# Patient Record
Sex: Female | Born: 1960 | Race: White | Hispanic: No | Marital: Married | Smoking: Never smoker
Health system: Southern US, Community
[De-identification: ages and names within clinical notes are randomized; demographics above are authoritative.]

## PROBLEM LIST (undated history)

## (undated) DIAGNOSIS — R220 Localized swelling, mass and lump, head: Secondary | ICD-10-CM

## (undated) DIAGNOSIS — G4730 Sleep apnea, unspecified: Secondary | ICD-10-CM

## (undated) HISTORY — PX: ABDOMINAL HYSTERECTOMY: SHX81

## (undated) HISTORY — PX: CHOLECYSTECTOMY: SHX55

---

## 2011-07-05 ENCOUNTER — Other Ambulatory Visit: Payer: Self-pay | Admitting: Obstetrics and Gynecology

## 2011-07-05 DIAGNOSIS — R928 Other abnormal and inconclusive findings on diagnostic imaging of breast: Secondary | ICD-10-CM

## 2011-07-06 ENCOUNTER — Ambulatory Visit
Admission: RE | Admit: 2011-07-06 | Discharge: 2011-07-06 | Disposition: A | Discharge status: Home / Self Care | Payer: PRIVATE HEALTH INSURANCE | Source: Ambulatory Visit | Attending: Obstetrics and Gynecology | Admitting: Obstetrics and Gynecology

## 2011-07-06 DIAGNOSIS — R928 Other abnormal and inconclusive findings on diagnostic imaging of breast: Secondary | ICD-10-CM

## 2013-05-03 IMAGING — MG MM DIGITAL DIAGNOSTIC UNILAT*L*
3 series · 3 of 3 positions shown · non-contrast
Comparison: Previous examinations, including the screening
mammogram dated 06/29/2011.

CLINICAL DATA: Left breast calcifications and possible mass at
recent screening mammography.  Mass felt by the patient along the
left anterior chest wall above the medial aspect of the breast.

DIGITAL DIAGNOSTIC LEFT MAMMOGRAM  AND LEFT BREAST ULTRASOUND:

[L CC]
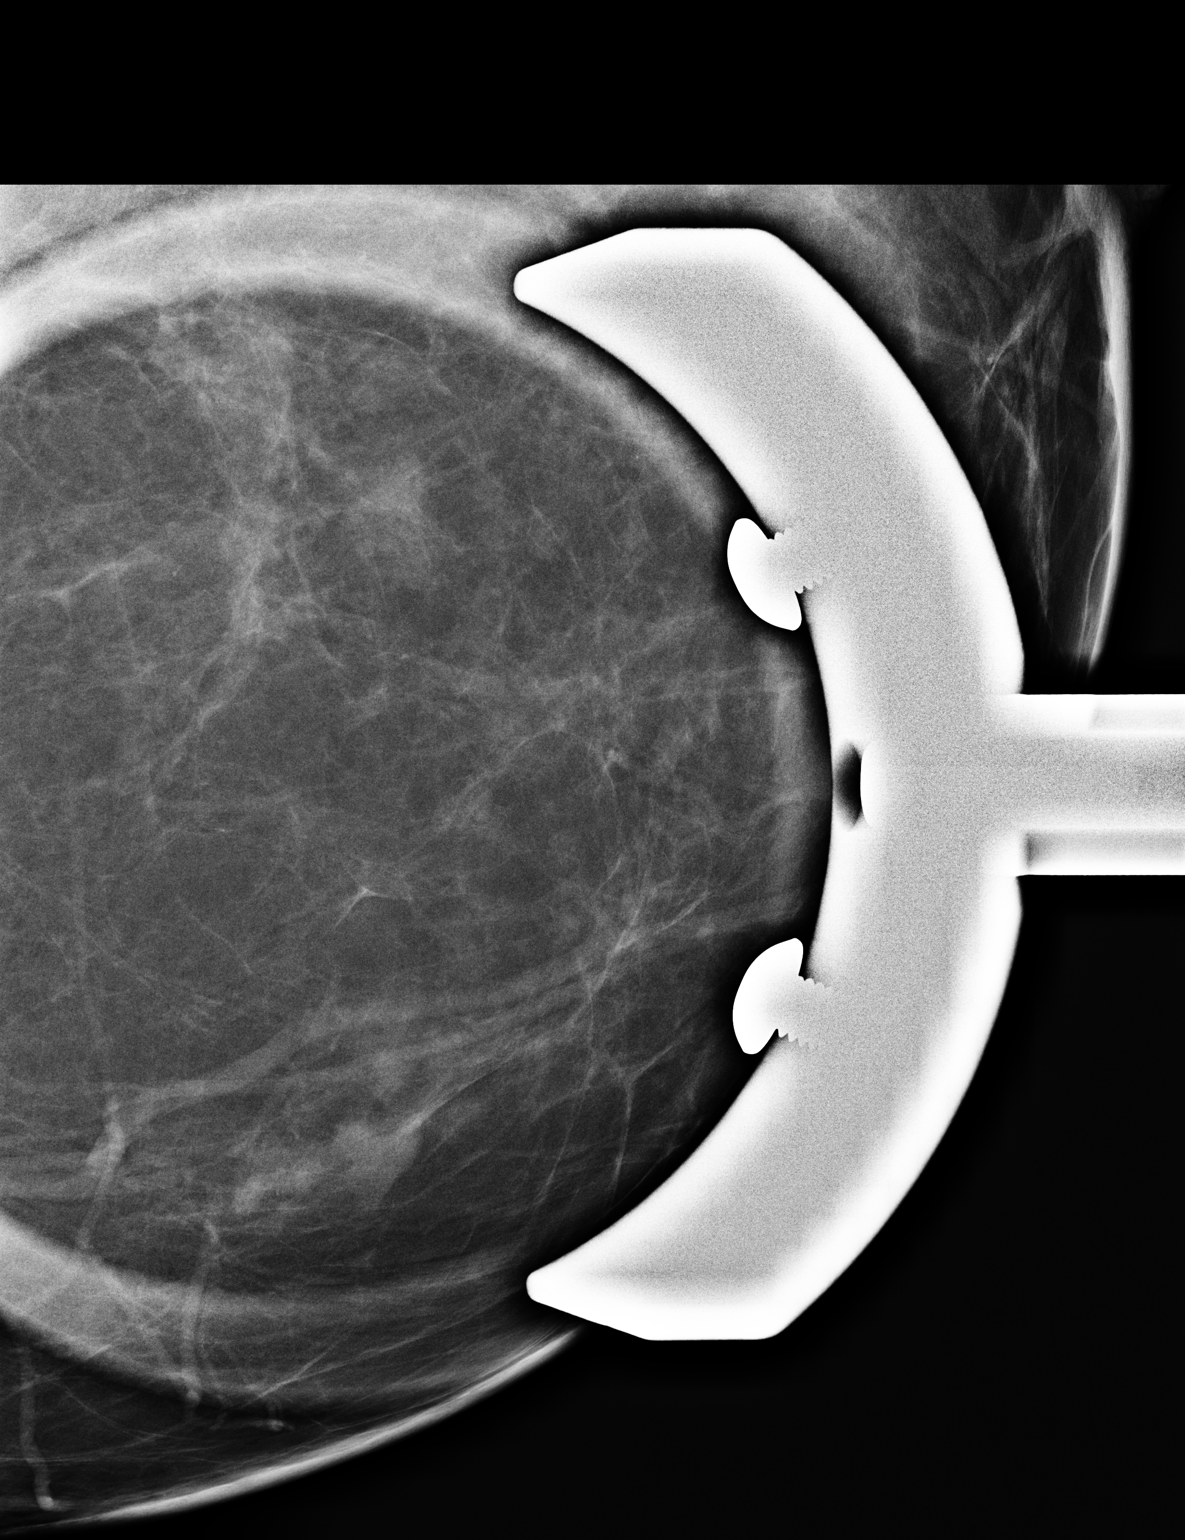

[L ML (1 of 2)]
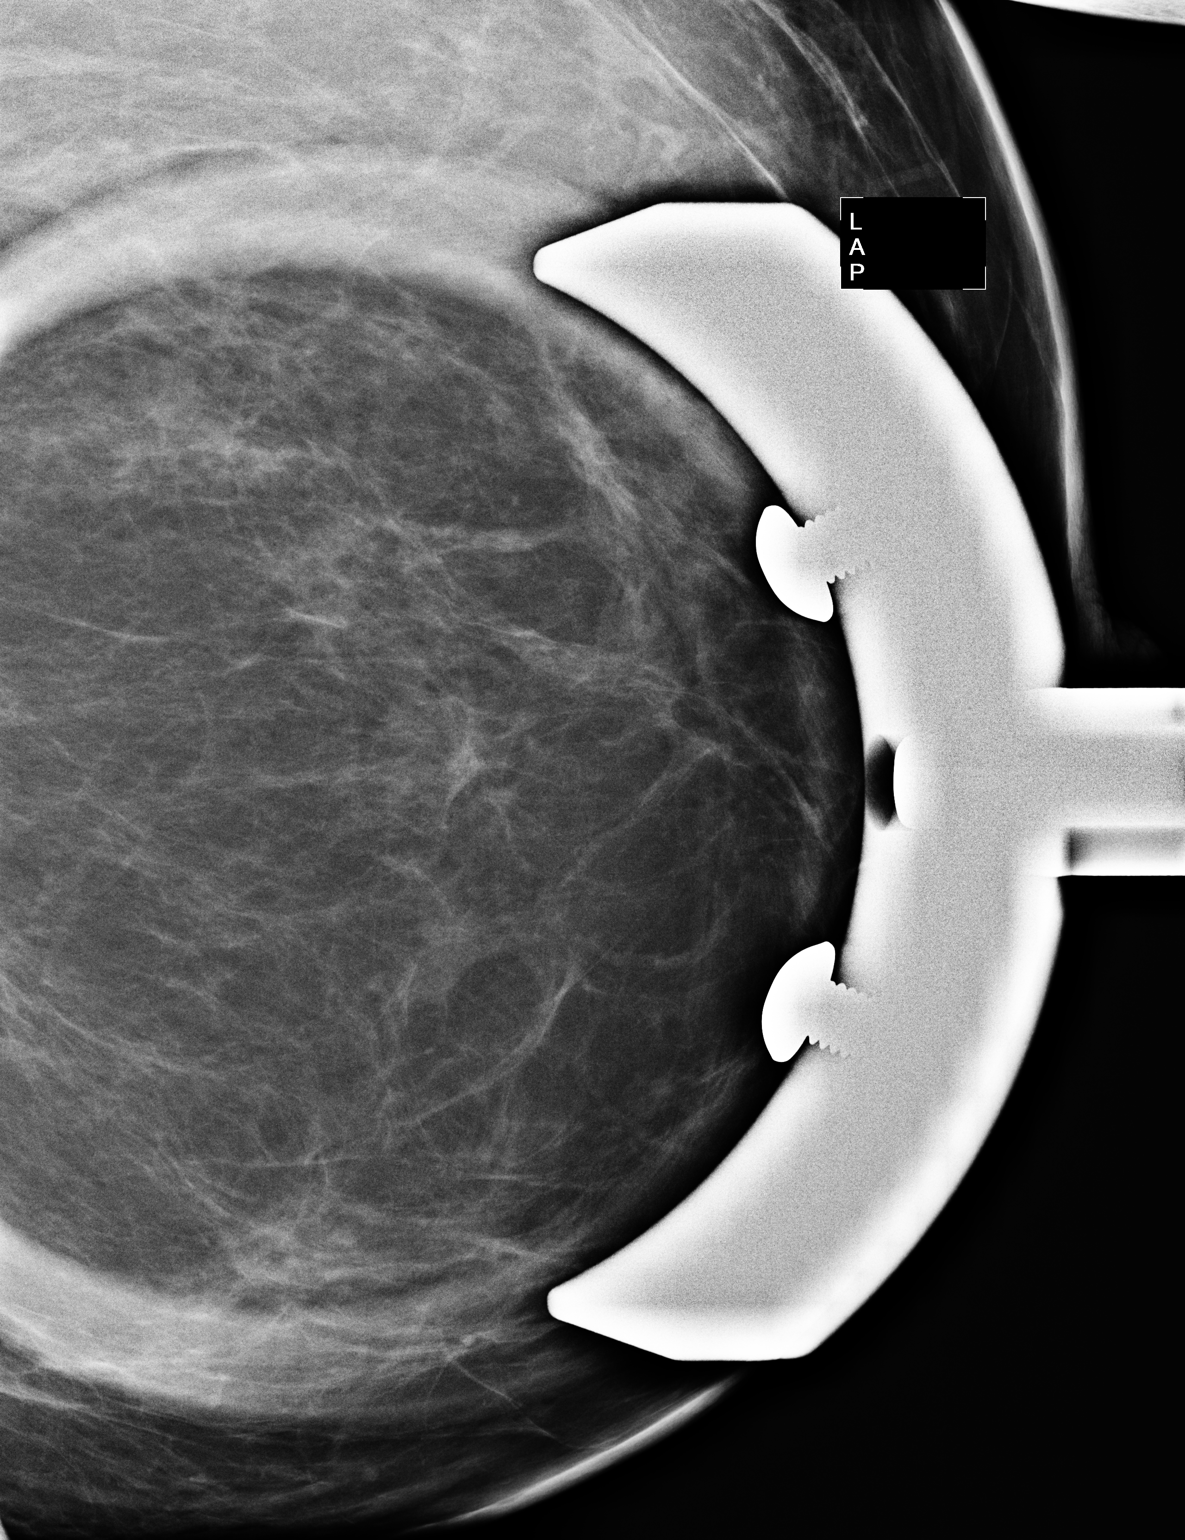

[L ML (2 of 2)]
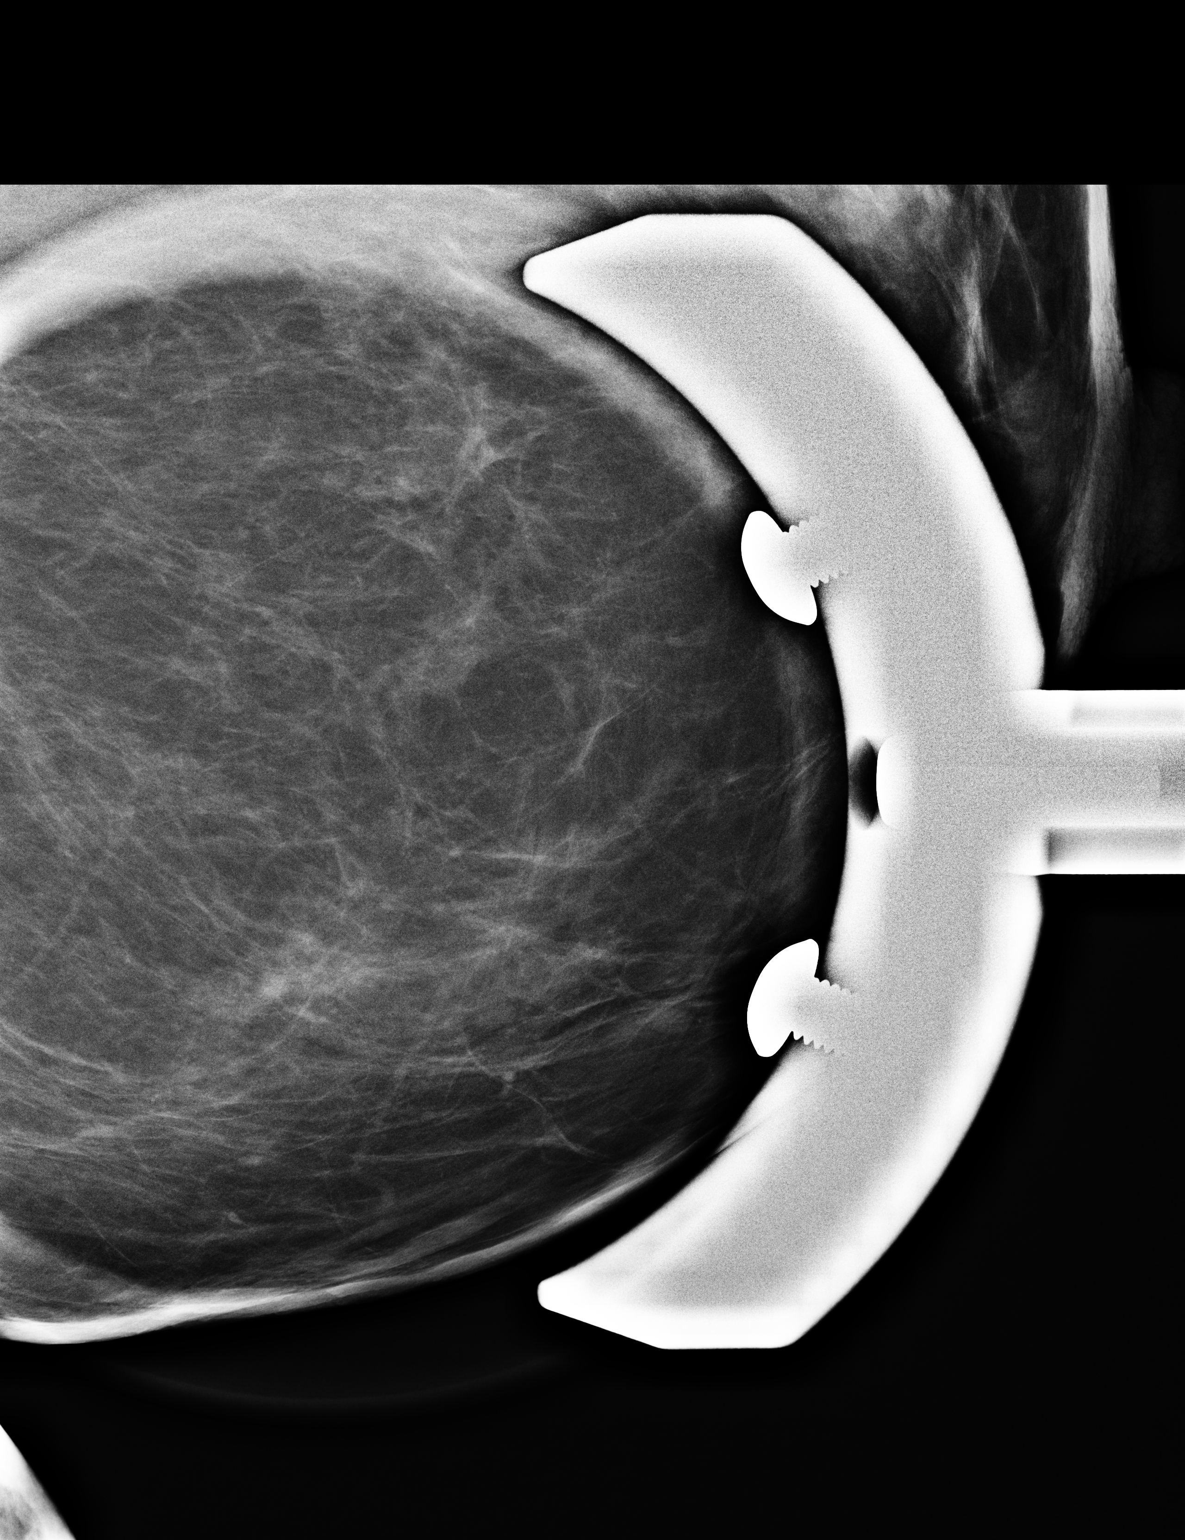

[3 of 3 positions shown; findings below may reference images not displayed]

FINDINGS: Spot magnification views of the left breast demonstrate
a persistent elongated, curvilinear density with smooth margins in
the far medial aspect of the left breast.  No calcifications are
seen.  A spot tangential view could not be obtained of the mass
felt by the patient, due to the fact that it is not in the breast.

On physical exam, the patient has an approximately 5 mm rounded,
smoothly marginated, mobile palpable mass beneath the skin in the
upper left chest wall above the medial aspect of the left breast.
There is no mass palpable in the medial left breast.

Ultrasound is performed, showing a 1.0 x 0.9 x 0.5 cm oval,
horizontally oriented, poorly defined echogenic mass-like area just
beneath the skin in the anterior left chest wall, corresponding to
the palpable mass.  This has some central decreased echogenicity.

In the 9:30 o'clock position of the left breast, 12 cm from the
nipple, an island of fibroglandular tissue is demonstrated
containing multiple small cysts.  These include a cluster of cyst
with a maximum combined diameter of 7 mm.  The largest individual
cyst has a maximum diameter of 4 mm.
IMPRESSION: 1.  Cluster of cysts within an island of fibroglandular tissue in
the 9:30 o'clock position of the left breast, corresponding to the
area of increased density seen mammographically.
2.  1.0 cm area of fat necrosis in the left upper chest wall
subcutaneous fat, corresponding to the mass felt by the patient.
3.  No evidence of malignancy.  Annual screening mammography is
recommended.

BI-RADS CATEGORY 2:  Benign finding(s).

Recommendation:  Bilateral screening mammogram in 1 year.

## 2013-05-03 IMAGING — US US BREAST*L*
1 series · 13 of 16 positions shown · non-contrast
Comparison: Previous examinations, including the screening
mammogram dated 06/29/2011.

CLINICAL DATA: Left breast calcifications and possible mass at
recent screening mammography.  Mass felt by the patient along the
left anterior chest wall above the medial aspect of the breast.

DIGITAL DIAGNOSTIC LEFT MAMMOGRAM  AND LEFT BREAST ULTRASOUND:

[Series 1: us breast*left* · 13 of 16 slices shown]
[im 1/16]
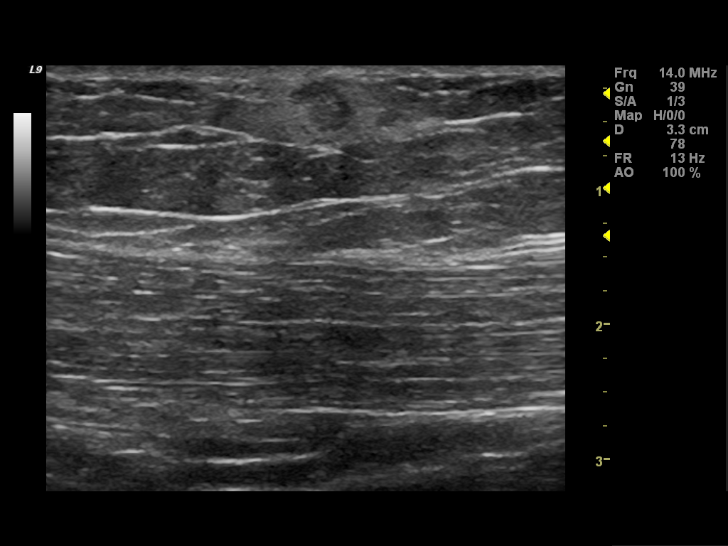
[im 2/16]
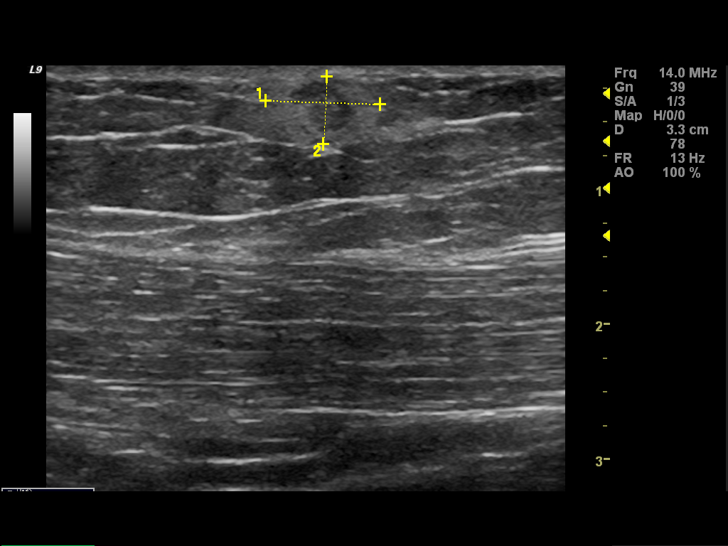
[im 4/16]
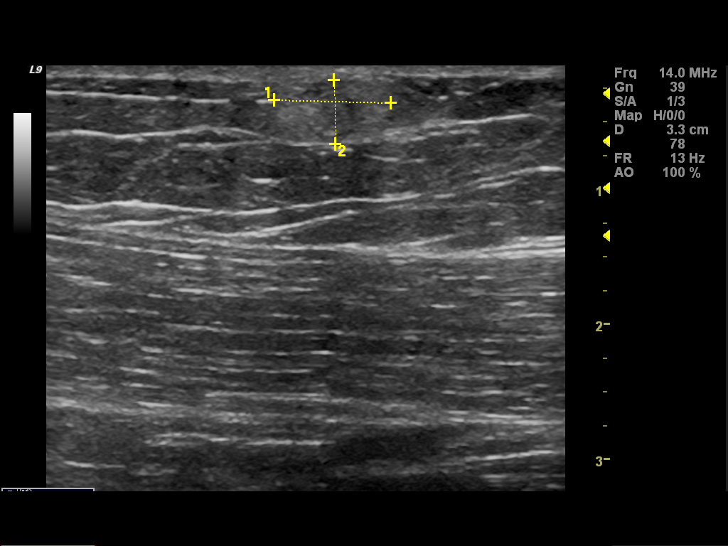
[im 5/16]
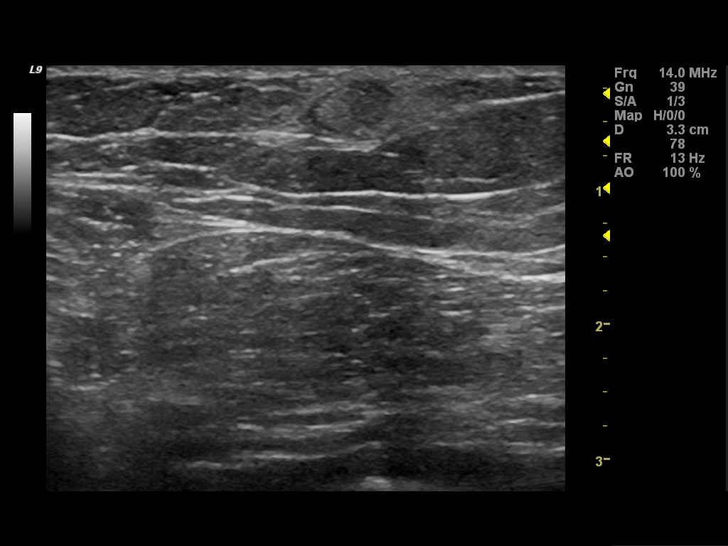
[im 6/16]
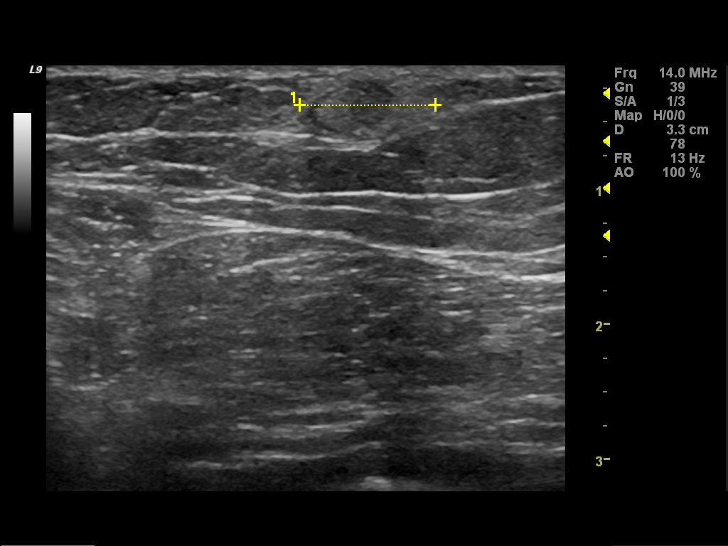
[im 7/16]
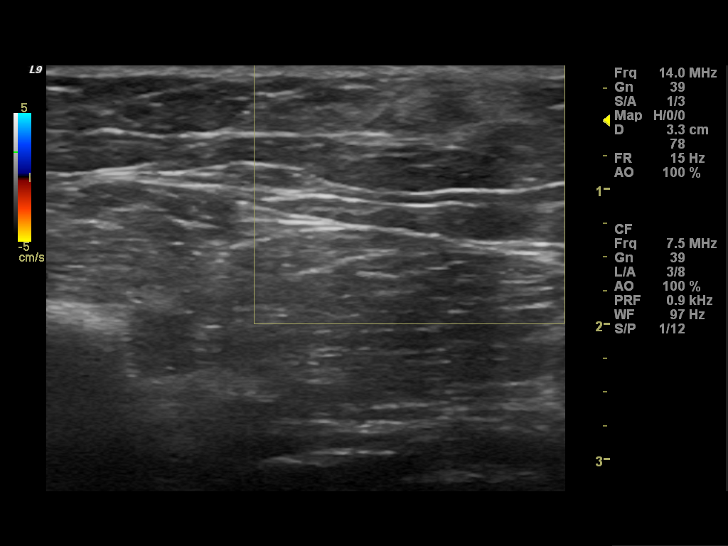
[im 9/16]
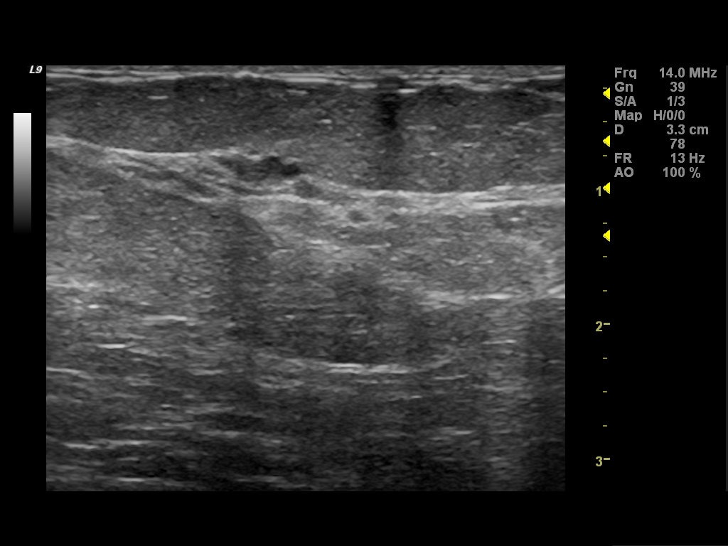
[im 10/16]
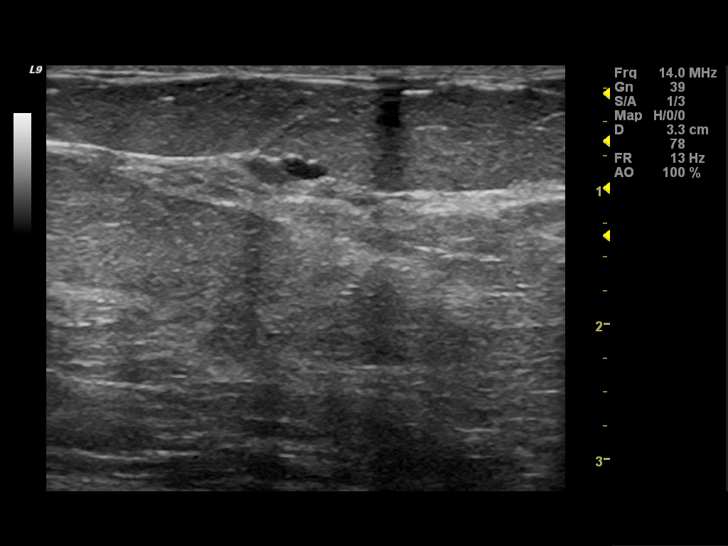
[im 11/16]
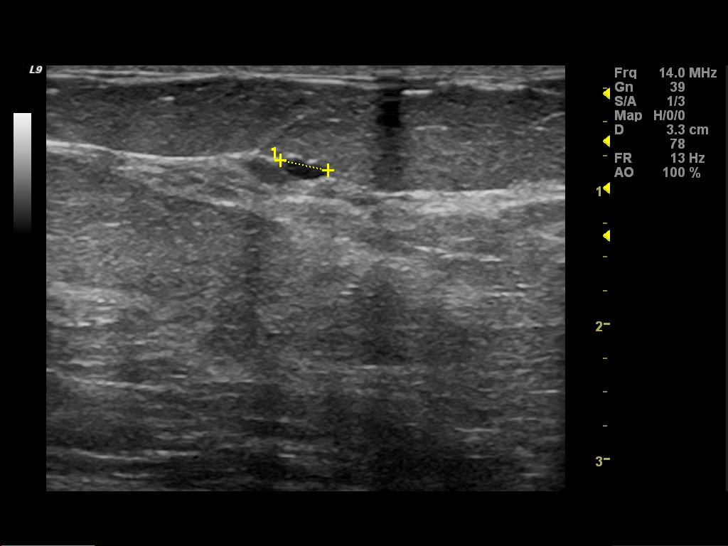
[im 12/16]
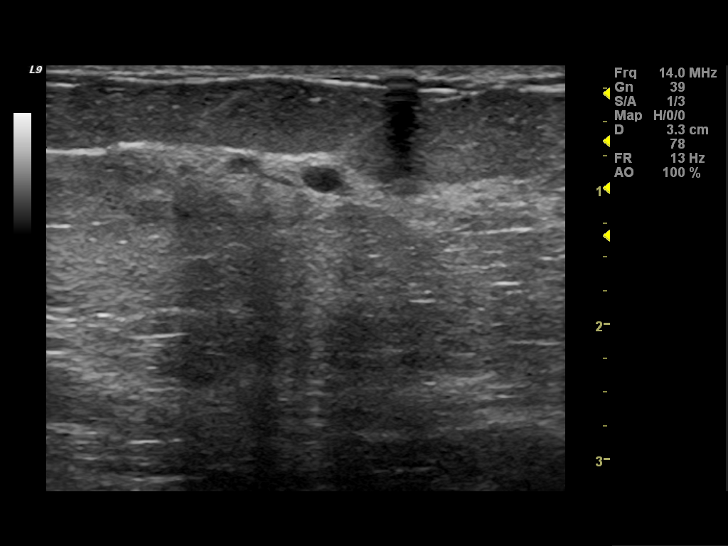
[im 13/16]
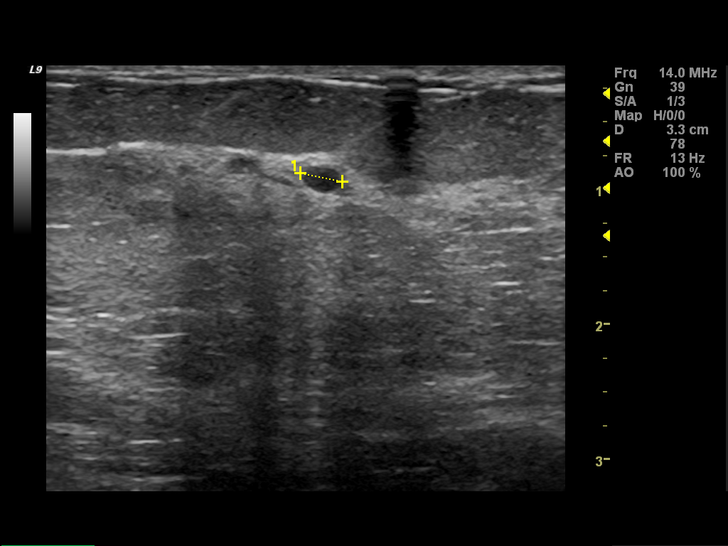
[im 15/16]
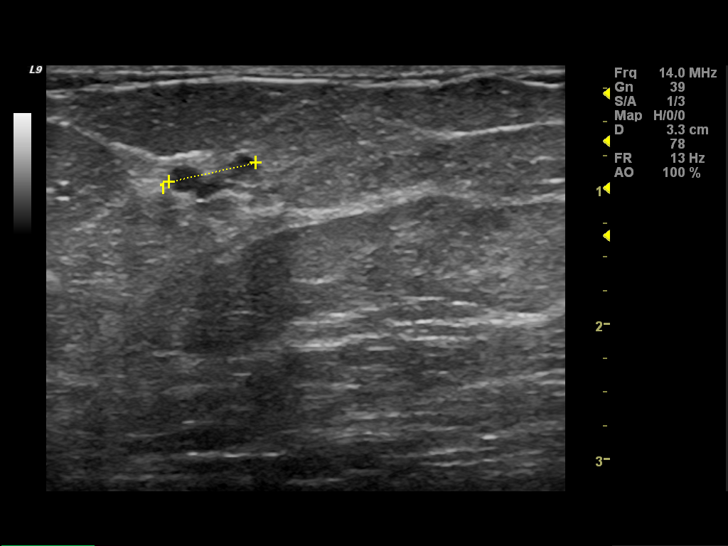
[im 16/16]
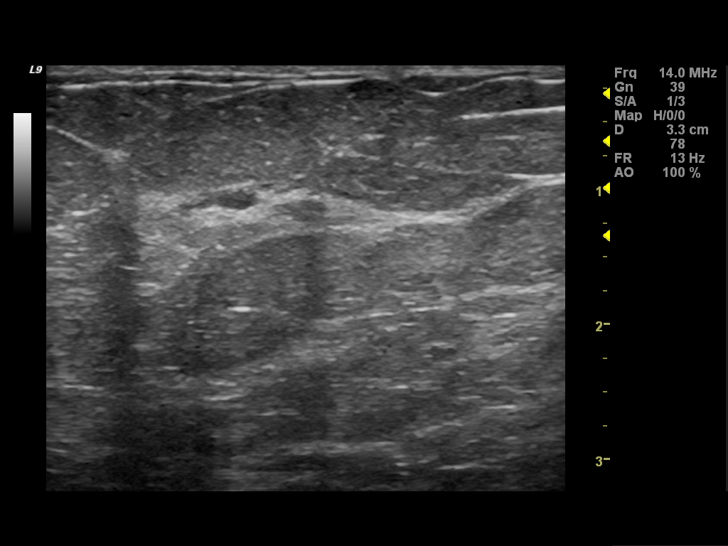

[13 of 16 positions shown; findings below may reference images not displayed]

FINDINGS: Spot magnification views of the left breast demonstrate
a persistent elongated, curvilinear density with smooth margins in
the far medial aspect of the left breast.  No calcifications are
seen.  A spot tangential view could not be obtained of the mass
felt by the patient, due to the fact that it is not in the breast.

On physical exam, the patient has an approximately 5 mm rounded,
smoothly marginated, mobile palpable mass beneath the skin in the
upper left chest wall above the medial aspect of the left breast.
There is no mass palpable in the medial left breast.

Ultrasound is performed, showing a 1.0 x 0.9 x 0.5 cm oval,
horizontally oriented, poorly defined echogenic mass-like area just
beneath the skin in the anterior left chest wall, corresponding to
the palpable mass.  This has some central decreased echogenicity.

In the 9:30 o'clock position of the left breast, 12 cm from the
nipple, an island of fibroglandular tissue is demonstrated
containing multiple small cysts.  These include a cluster of cyst
with a maximum combined diameter of 7 mm.  The largest individual
cyst has a maximum diameter of 4 mm.
IMPRESSION: 1.  Cluster of cysts within an island of fibroglandular tissue in
the 9:30 o'clock position of the left breast, corresponding to the
area of increased density seen mammographically.
2.  1.0 cm area of fat necrosis in the left upper chest wall
subcutaneous fat, corresponding to the mass felt by the patient.
3.  No evidence of malignancy.  Annual screening mammography is
recommended.

BI-RADS CATEGORY 2:  Benign finding(s).

Recommendation:  Bilateral screening mammogram in 1 year.

## 2014-09-30 ENCOUNTER — Other Ambulatory Visit: Payer: Self-pay | Admitting: Obstetrics and Gynecology

## 2014-09-30 DIAGNOSIS — R928 Other abnormal and inconclusive findings on diagnostic imaging of breast: Secondary | ICD-10-CM

## 2014-10-03 ENCOUNTER — Ambulatory Visit
Admission: RE | Admit: 2014-10-03 | Discharge: 2014-10-03 | Disposition: A | Discharge status: Home / Self Care | Payer: BLUE CROSS/BLUE SHIELD | Source: Ambulatory Visit | Attending: Obstetrics and Gynecology | Admitting: Obstetrics and Gynecology

## 2014-10-03 DIAGNOSIS — R928 Other abnormal and inconclusive findings on diagnostic imaging of breast: Secondary | ICD-10-CM

## 2014-11-14 ENCOUNTER — Other Ambulatory Visit: Payer: Self-pay | Admitting: Obstetrics and Gynecology

## 2014-11-14 DIAGNOSIS — R1011 Right upper quadrant pain: Secondary | ICD-10-CM

## 2014-11-20 ENCOUNTER — Ambulatory Visit
Admission: RE | Admit: 2014-11-20 | Discharge: 2014-11-20 | Disposition: A | Discharge status: Home / Self Care | Payer: BLUE CROSS/BLUE SHIELD | Source: Ambulatory Visit | Attending: Obstetrics and Gynecology | Admitting: Obstetrics and Gynecology

## 2014-11-20 DIAGNOSIS — R1011 Right upper quadrant pain: Secondary | ICD-10-CM

## 2014-11-20 MED ORDER — IOPAMIDOL (ISOVUE-300) INJECTION 61%
125.0000 mL | Freq: Once | INTRAVENOUS | Status: AC | PRN
  Administered 2014-11-20: 125 mL via INTRAVENOUS

## 2014-11-27 ENCOUNTER — Ambulatory Visit: Payer: BLUE CROSS/BLUE SHIELD | Attending: Otolaryngology

## 2014-11-27 DIAGNOSIS — G4733 Obstructive sleep apnea (adult) (pediatric): Secondary | ICD-10-CM | POA: Diagnosis not present

## 2014-11-27 DIAGNOSIS — E669 Obesity, unspecified: Secondary | ICD-10-CM | POA: Insufficient documentation

## 2014-11-27 DIAGNOSIS — R0683 Snoring: Secondary | ICD-10-CM | POA: Diagnosis not present

## 2015-04-08 ENCOUNTER — Ambulatory Visit
Admission: RE | Admit: 2015-04-08 | Discharge: 2015-04-08 | Disposition: A | Discharge status: Home / Self Care | Payer: BLUE CROSS/BLUE SHIELD | Source: Ambulatory Visit | Attending: Family Medicine | Admitting: Family Medicine

## 2015-04-08 ENCOUNTER — Other Ambulatory Visit: Payer: Self-pay | Admitting: Family Medicine

## 2015-04-08 DIAGNOSIS — R05 Cough: Secondary | ICD-10-CM

## 2015-04-08 DIAGNOSIS — R509 Fever, unspecified: Secondary | ICD-10-CM | POA: Diagnosis not present

## 2015-04-08 DIAGNOSIS — R059 Cough, unspecified: Secondary | ICD-10-CM

## 2015-12-24 ENCOUNTER — Encounter (HOSPITAL_BASED_OUTPATIENT_CLINIC_OR_DEPARTMENT_OTHER): Payer: Self-pay | Admitting: *Deleted

## 2015-12-24 NOTE — H&P (Signed)
Subjective:    Patient ID: Buford DresserLisa Marquard is a 55 y.o. female.  HPI  Referred by Dr. Arelia SneddonMcComb for evaluation mass forehead present for 10 years, reports small growth during this time, feels may have become more noticeable with recent wt loss. No history drainage fluid. Reports occasional "tight" feeling. Denies trauma to area.       Objective:   Physical Exam   HENT:  Right upper forehead with nonmobile mass 1 cm without visible punctum  Cardiovascular: Normal rate.  normal heart sounds Pulmonary/Chest: Effort normal. 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 We attempted in office excision under local and unable to remove. Plan removal in OR under sedation. Similar risks as discussed previously : scar maturation over several months, risk recurrence or additional procedures pending pathology. Counseled to expect some bruising and black eye, suture removal at one week, possible HA associated with procedure.    Glenna FellowsBrinda Alastor Kneale, MD [facility] Plastic & Reconstructive Surgery [phone], pin [phone]

## 2015-12-26 ENCOUNTER — Ambulatory Visit (HOSPITAL_BASED_OUTPATIENT_CLINIC_OR_DEPARTMENT_OTHER)
Admission: RE | Admit: 2015-12-26 | Discharge: 2015-12-26 | Disposition: A | Discharge status: Home / Self Care | Payer: BLUE CROSS/BLUE SHIELD | Source: Ambulatory Visit | Attending: Plastic Surgery | Admitting: Plastic Surgery

## 2015-12-26 ENCOUNTER — Ambulatory Visit (HOSPITAL_BASED_OUTPATIENT_CLINIC_OR_DEPARTMENT_OTHER): Payer: BLUE CROSS/BLUE SHIELD | Admitting: Anesthesiology

## 2015-12-26 ENCOUNTER — Encounter (HOSPITAL_BASED_OUTPATIENT_CLINIC_OR_DEPARTMENT_OTHER): Payer: Self-pay | Admitting: *Deleted

## 2015-12-26 ENCOUNTER — Encounter (HOSPITAL_BASED_OUTPATIENT_CLINIC_OR_DEPARTMENT_OTHER)
Admission: RE | Disposition: A | Discharge status: Home / Self Care | Payer: Self-pay | Source: Ambulatory Visit | Attending: Plastic Surgery

## 2015-12-26 DIAGNOSIS — Z9989 Dependence on other enabling machines and devices: Secondary | ICD-10-CM | POA: Insufficient documentation

## 2015-12-26 DIAGNOSIS — L821 Other seborrheic keratosis: Secondary | ICD-10-CM | POA: Insufficient documentation

## 2015-12-26 DIAGNOSIS — G4730 Sleep apnea, unspecified: Secondary | ICD-10-CM | POA: Insufficient documentation

## 2015-12-26 DIAGNOSIS — M898X8 Other specified disorders of bone, other site: Secondary | ICD-10-CM | POA: Insufficient documentation

## 2015-12-26 HISTORY — DX: Sleep apnea, unspecified: G47.30

## 2015-12-26 HISTORY — DX: Localized swelling, mass and lump, head: R22.0

## 2015-12-26 HISTORY — PX: MASS EXCISION: SHX2000

## 2015-12-26 SURGERY — EXCISION MASS
Anesthesia: General | Site: Face

## 2015-12-26 MED ORDER — PROPOFOL 10 MG/ML IV BOLUS
INTRAVENOUS | Status: AC
  Filled 2015-12-26: qty 20

## 2015-12-26 MED ORDER — CEFAZOLIN SODIUM-DEXTROSE 2-4 GM/100ML-% IV SOLN
2.0000 g | INTRAVENOUS | Status: AC
  Administered 2015-12-26: 2 g via INTRAVENOUS

## 2015-12-26 MED ORDER — LIDOCAINE-EPINEPHRINE 1 %-1:100000 IJ SOLN
INTRAMUSCULAR | Status: AC
  Filled 2015-12-26: qty 1

## 2015-12-26 MED ORDER — HYDROCODONE-ACETAMINOPHEN 7.5-325 MG PO TABS: 1.0000 | ORAL_TABLET | ORAL | 0 refills | Status: DC | PRN

## 2015-12-26 MED ORDER — FENTANYL CITRATE (PF) 100 MCG/2ML IJ SOLN
INTRAMUSCULAR | Status: AC
  Filled 2015-12-26: qty 2

## 2015-12-26 MED ORDER — ONDANSETRON HCL 4 MG/2ML IJ SOLN
INTRAMUSCULAR | Status: DC | PRN
  Administered 2015-12-26: 4 mg via INTRAVENOUS

## 2015-12-26 MED ORDER — LACTATED RINGERS IV SOLN
INTRAVENOUS | Status: DC
  Administered 2015-12-26 (×2): via INTRAVENOUS

## 2015-12-26 MED ORDER — MIDAZOLAM HCL 2 MG/2ML IJ SOLN
INTRAMUSCULAR | Status: AC
  Filled 2015-12-26: qty 2

## 2015-12-26 MED ORDER — KETOROLAC TROMETHAMINE 30 MG/ML IJ SOLN: 30.0000 mg | Freq: Once | INTRAMUSCULAR | Status: DC | PRN

## 2015-12-26 MED ORDER — LIDOCAINE 2% (20 MG/ML) 5 ML SYRINGE
INTRAMUSCULAR | Status: DC | PRN
  Administered 2015-12-26: 6 mg via INTRAVENOUS

## 2015-12-26 MED ORDER — LIDOCAINE 2% (20 MG/ML) 5 ML SYRINGE
INTRAMUSCULAR | Status: AC
  Filled 2015-12-26: qty 5

## 2015-12-26 MED ORDER — EPHEDRINE SULFATE 50 MG/ML IJ SOLN
INTRAMUSCULAR | Status: DC | PRN
  Administered 2015-12-26 (×2): 10 mg via INTRAVENOUS

## 2015-12-26 MED ORDER — HYDROCODONE-ACETAMINOPHEN 7.5-325 MG PO TABS: 1.0000 | ORAL_TABLET | Freq: Once | ORAL | Status: DC | PRN

## 2015-12-26 MED ORDER — LIDOCAINE-EPINEPHRINE 1 %-1:100000 IJ SOLN
INTRAMUSCULAR | Status: DC | PRN
  Administered 2015-12-26: 1.5 mL

## 2015-12-26 MED ORDER — BACITRACIN 500 UNIT/GM EX OINT
TOPICAL_OINTMENT | CUTANEOUS | Status: DC | PRN
  Administered 2015-12-26: 1 via TOPICAL

## 2015-12-26 MED ORDER — BACITRACIN ZINC 500 UNIT/GM EX OINT
TOPICAL_OINTMENT | CUTANEOUS | Status: AC
  Filled 2015-12-26: qty 28.35

## 2015-12-26 MED ORDER — FENTANYL CITRATE (PF) 100 MCG/2ML IJ SOLN
50.0000 ug | INTRAMUSCULAR | Status: DC | PRN
  Administered 2015-12-26: 50 ug via INTRAVENOUS
  Administered 2015-12-26: 100 ug via INTRAVENOUS

## 2015-12-26 MED ORDER — ONDANSETRON HCL 4 MG/2ML IJ SOLN
INTRAMUSCULAR | Status: AC
  Filled 2015-12-26: qty 2

## 2015-12-26 MED ORDER — CEFAZOLIN SODIUM-DEXTROSE 2-4 GM/100ML-% IV SOLN
INTRAVENOUS | Status: AC
  Filled 2015-12-26: qty 100

## 2015-12-26 MED ORDER — PROPOFOL 10 MG/ML IV BOLUS
INTRAVENOUS | Status: DC | PRN
  Administered 2015-12-26: 150 mg via INTRAVENOUS

## 2015-12-26 MED ORDER — GLYCOPYRROLATE 0.2 MG/ML IJ SOLN: 0.2000 mg | Freq: Once | INTRAMUSCULAR | Status: DC | PRN

## 2015-12-26 MED ORDER — MIDAZOLAM HCL 2 MG/2ML IJ SOLN
1.0000 mg | INTRAMUSCULAR | Status: DC | PRN
  Administered 2015-12-26: 2 mg via INTRAVENOUS

## 2015-12-26 MED ORDER — SCOPOLAMINE 1 MG/3DAYS TD PT72: 1.0000 | MEDICATED_PATCH | Freq: Once | TRANSDERMAL | Status: DC | PRN

## 2015-12-26 MED ORDER — FENTANYL CITRATE (PF) 100 MCG/2ML IJ SOLN: 25.0000 ug | INTRAMUSCULAR | Status: DC | PRN

## 2015-12-26 MED ORDER — BACITRACIN ZINC 500 UNIT/GM EX OINT
TOPICAL_OINTMENT | CUTANEOUS | Status: AC
  Filled 2015-12-26: qty 0.9

## 2015-12-26 MED ORDER — DEXAMETHASONE SODIUM PHOSPHATE 10 MG/ML IJ SOLN
INTRAMUSCULAR | Status: AC
  Filled 2015-12-26: qty 1

## 2015-12-26 MED ORDER — DEXAMETHASONE SODIUM PHOSPHATE 4 MG/ML IJ SOLN
INTRAMUSCULAR | Status: DC | PRN
  Administered 2015-12-26: 10 mg via INTRAVENOUS

## 2015-12-26 SURGICAL SUPPLY — 55 items
BENZOIN TINCTURE PRP APPL 2/3 (GAUZE/BANDAGES/DRESSINGS) IMPLANT
BLADE CLIPPER SURG (BLADE) IMPLANT
BLADE SURG 11 STRL SS (BLADE) IMPLANT
BLADE SURG 15 STRL LF DISP TIS (BLADE) ×1 IMPLANT
BLADE SURG 15 STRL SS (BLADE) ×1
BUR EGG/OVAL CARBIDE (BURR) ×2 IMPLANT
CANISTER SUCT 1200ML W/VALVE (MISCELLANEOUS) IMPLANT
CHLORAPREP W/TINT 26ML (MISCELLANEOUS) ×2 IMPLANT
COVER BACK TABLE 60X90IN (DRAPES) ×2 IMPLANT
COVER MAYO STAND STRL (DRAPES) ×2 IMPLANT
DRAIN JP 10F RND SILICONE (MISCELLANEOUS) IMPLANT
DRAPE LAPAROTOMY 100X72 PEDS (DRAPES) IMPLANT
DRAPE U-SHAPE 76X120 STRL (DRAPES) IMPLANT
DRSG TELFA 3X8 NADH (GAUZE/BANDAGES/DRESSINGS) IMPLANT
ELECT COATED BLADE 2.86 ST (ELECTRODE) IMPLANT
ELECT NEEDLE BLADE 2-5/6 (NEEDLE) ×2 IMPLANT
ELECT REM PT RETURN 9FT ADLT (ELECTROSURGICAL)
ELECT REM PT RETURN 9FT PED (ELECTROSURGICAL)
ELECTRODE REM PT RETRN 9FT PED (ELECTROSURGICAL) IMPLANT
ELECTRODE REM PT RTRN 9FT ADLT (ELECTROSURGICAL) IMPLANT
EVACUATOR SILICONE 100CC (DRAIN) IMPLANT
GAUZE XEROFORM 1X8 LF (GAUZE/BANDAGES/DRESSINGS) IMPLANT
GLOVE BIO SURGEON STRL SZ 6 (GLOVE) ×2 IMPLANT
GOWN STRL REUS W/ TWL LRG LVL3 (GOWN DISPOSABLE) ×2 IMPLANT
GOWN STRL REUS W/TWL LRG LVL3 (GOWN DISPOSABLE) ×2
LIQUID BAND (GAUZE/BANDAGES/DRESSINGS) IMPLANT
NEEDLE HYPO 30GX1 BEV (NEEDLE) IMPLANT
NEEDLE PRECISIONGLIDE 27X1.5 (NEEDLE) ×2 IMPLANT
NS IRRIG 1000ML POUR BTL (IV SOLUTION) IMPLANT
PACK BASIN DAY SURGERY FS (CUSTOM PROCEDURE TRAY) ×2 IMPLANT
PENCIL BUTTON HOLSTER BLD 10FT (ELECTRODE) ×2 IMPLANT
RUBBERBAND STERILE (MISCELLANEOUS) IMPLANT
SHEET MEDIUM DRAPE 40X70 STRL (DRAPES) IMPLANT
SLEEVE SCD COMPRESS KNEE MED (MISCELLANEOUS) IMPLANT
SPONGE GAUZE 2X2 8PLY STRL LF (GAUZE/BANDAGES/DRESSINGS) IMPLANT
SPONGE GAUZE 4X4 12PLY STER LF (GAUZE/BANDAGES/DRESSINGS) IMPLANT
SPONGE LAP 18X18 X RAY DECT (DISPOSABLE) IMPLANT
STRIP CLOSURE SKIN 1/2X4 (GAUZE/BANDAGES/DRESSINGS) IMPLANT
SUCTION FRAZIER HANDLE 10FR (MISCELLANEOUS)
SUCTION TUBE FRAZIER 10FR DISP (MISCELLANEOUS) IMPLANT
SUT ETHILON 4 0 PS 2 18 (SUTURE) IMPLANT
SUT MNCRL AB 4-0 PS2 18 (SUTURE) IMPLANT
SUT MON AB 5-0 P3 18 (SUTURE) ×2 IMPLANT
SUT PLAIN 5 0 P 3 18 (SUTURE) IMPLANT
SUT PROLENE 5 0 P 3 (SUTURE) ×2 IMPLANT
SUT PROLENE 6 0 P 1 18 (SUTURE) IMPLANT
SUT VICRYL 4-0 PS2 18IN ABS (SUTURE) IMPLANT
SWAB COLLECTION DEVICE MRSA (MISCELLANEOUS) IMPLANT
SWAB CULTURE ESWAB REG 1ML (MISCELLANEOUS) IMPLANT
SYR BULB 3OZ (MISCELLANEOUS) IMPLANT
SYR CONTROL 10ML LL (SYRINGE) ×2 IMPLANT
TOWEL OR 17X24 6PK STRL BLUE (TOWEL DISPOSABLE) ×2 IMPLANT
TRAY DSU PREP LF (CUSTOM PROCEDURE TRAY) IMPLANT
TUBE CONNECTING 20X1/4 (TUBING) IMPLANT
YANKAUER SUCT BULB TIP 10FT TU (MISCELLANEOUS) IMPLANT

## 2015-12-26 NOTE — Transfer of Care (Signed)
Immediate Anesthesia Transfer of Care Note  Patient: Margaret NidaLisa K Dullea  Procedure(s) Performed: Procedure(s): EXCISION SUBFACIAL MASS FOREHEAD 1CM (N/A)  Patient Location: PACU  Anesthesia Type:General  Level of Consciousness: awake, alert  and oriented  Airway & Oxygen Therapy: Patient Spontanous Breathing and Patient connected to face mask oxygen  Post-op Assessment: Report given to RN and Post -op Vital signs reviewed and stable  Post vital signs: Reviewed and stable  Last Vitals:  Vitals:   12/26/15 1045  BP: 125/69  Pulse: 69  Temp: 36.7 C    Last Pain:  Vitals:   12/26/15 1045  TempSrc: Oral  PainSc: 4       Patients Stated Pain Goal: 2 (12/26/15 1045)  Complications: No apparent anesthesia complications

## 2015-12-26 NOTE — Interval H&P Note (Signed)
History and Physical Interval Note:  12/26/2015 11:53 AM  Margaret NidaLisa K Grabski  has presented today for surgery, with the diagnosis of SOFT TISSUE MASS FOREHEAD  The various methods of treatment have been discussed with the patient and family. After consideration of risks, benefits and other options for treatment, the patient has consented to  Procedure(s): EXCISION SUBFACIAL MASS FOREHEAD 1CM (N/A) as a surgical intervention .  The patient's history has been reviewed, patient examined, no change in status, stable for surgery.  I have reviewed the patient's chart and labs.  Questions were answered to the patient's satisfaction.     Sundeep Destin

## 2015-12-26 NOTE — Anesthesia Postprocedure Evaluation (Signed)
Anesthesia Post Note  Patient: Margaret Bennett  Procedure(s) Performed: Procedure(s) (LRB): EXCISION SUBFACIAL MASS FOREHEAD 1CM (N/A)  Patient location during evaluation: PACU Anesthesia Type: General Level of consciousness: awake and alert Pain management: pain level controlled Vital Signs Assessment: post-procedure vital signs reviewed and stable Respiratory status: spontaneous breathing, nonlabored ventilation, respiratory function stable and patient connected to nasal cannula oxygen Cardiovascular status: blood pressure returned to baseline and stable Postop Assessment: no signs of nausea or vomiting Anesthetic complications: no    Last Vitals:  Vitals:   12/26/15 1345 12/26/15 1400  BP: 132/76 135/77  Pulse: 71 70  Resp: 14 16  Temp:  36.6 C    Last Pain:  Vitals:   12/26/15 1400  TempSrc:   PainSc: 0-No pain                 Kennieth RadFitzgerald, Codi Kertz E

## 2015-12-26 NOTE — Discharge Instructions (Signed)

## 2015-12-26 NOTE — Op Note (Signed)
Operative Note   DATE OF OPERATION: 10.20.201  LOCATION: [facility]-outpatient  SURGICAL DIVISION: Plastic Surgery  PREOPERATIVE DIAGNOSES:  Mass forehead  POSTOPERATIVE DIAGNOSES:  same  PROCEDURE:  Excision bony mass forehead.  SURGEON: Margaret FellowsBrinda Naveen Clardy MD MBA  ASSISTANT: none  ANESTHESIA:  General.   EBL: 10 ml  COMPLICATIONS: None immediate.   INDICATIONS FOR PROCEDURE:  The patient, Margaret Bennett, is a 55 y.o. female born on [birth year], is here for excision bony mass frontal bone present for several years. Unable to remove mass under local anesthesia 2 days prior to surgery.   FINDINGS: 1 cm protuberance of right frontal bone.   DESCRIPTION OF PROCEDURE:  The patient's operative site was marked with the patient in the preoperative area. The patient was taken to the operating room. SCDs were placed and IV antibiotics were given. The patient's operative site was prepped and draped in a sterile fashion. A time out was performed and all information was confirmed to be correct. Sutures removed from right forehead and mass frontal bone exposed. Elevators utilized in attempt to remove mass without success. Tangential shaves of mass completed with osteotome for obtaining specimen for pathology. Burr and rasp then used to remove mass to smooth contour frontal bone. No diploe encountered during procedure. Wound irrigated and closure completed with 5-0 monocryl in frontalis muscle and dermis followed by running 6-0 prolene. Antibiotic ointment applied.  The patient was allowed to wake from anesthesia, extubated and taken to the recovery room in satisfactory condition.   SPECIMENS: frontal bone mass  DRAINS: none  Margaret FellowsBrinda Josi Roediger, MD [facility] Pl[facility] Reconstructive Surgery [phone], pin [phone]

## 2015-12-26 NOTE — Anesthesia Preprocedure Evaluation (Signed)
Anesthesia Evaluation  Patient identified by MRN, date of birth, ID band Patient awake    Reviewed: Allergy & Precautions, NPO status , Patient's Chart, lab work & pertinent test results  Airway Mallampati: III  TM Distance: >3 FB Neck ROM: Full    Dental  (+) Dental Advisory Given   Pulmonary sleep apnea and Continuous Positive Airway Pressure Ventilation ,    breath sounds clear to auscultation       Cardiovascular negative cardio ROS   Rhythm:Regular Rate:Normal     Neuro/Psych negative neurological ROS     GI/Hepatic negative GI ROS, Neg liver ROS,   Endo/Other  negative endocrine ROS  Renal/GU negative Renal ROS     Musculoskeletal   Abdominal   Peds  Hematology negative hematology ROS (+)   Anesthesia Other Findings   Reproductive/Obstetrics                             Anesthesia Physical Anesthesia Plan  ASA: II  Anesthesia Plan: General   Post-op Pain Management:    Induction: Intravenous  Airway Management Planned: LMA  Additional Equipment:   Intra-op Plan:   Post-operative Plan: Extubation in OR  Informed Consent: I have reviewed the patients History and Physical, chart, labs and discussed the procedure including the risks, benefits and alternatives for the proposed anesthesia with the patient or authorized representative who has indicated his/her understanding and acceptance.   Dental advisory given  Plan Discussed with: CRNA  Anesthesia Plan Comments:         Anesthesia Quick Evaluation

## 2015-12-26 NOTE — Anesthesia Procedure Notes (Signed)
Procedure Name: LMA Insertion Date/Time: 12/26/2015 10:35 AM Performed by: Burna CashONRAD, Valentine Barney C Pre-anesthesia Checklist: Patient identified, Emergency Drugs available, Suction available and Patient being monitored Patient Re-evaluated:Patient Re-evaluated prior to inductionOxygen Delivery Method: Circle system utilized Preoxygenation: Pre-oxygenation with 100% oxygen Intubation Type: IV induction Ventilation: Mask ventilation without difficulty LMA: LMA flexible inserted LMA Size: 4.0 Number of attempts: 1 Airway Equipment and Method: Bite block Placement Confirmation: positive ETCO2 Tube secured with: Tape Dental Injury: Teeth and Oropharynx as per pre-operative assessment

## 2015-12-29 ENCOUNTER — Encounter (HOSPITAL_BASED_OUTPATIENT_CLINIC_OR_DEPARTMENT_OTHER): Payer: Self-pay | Admitting: Plastic Surgery

## 2017-08-12 DIAGNOSIS — M533 Sacrococcygeal disorders, not elsewhere classified: Secondary | ICD-10-CM | POA: Diagnosis not present

## 2017-08-12 DIAGNOSIS — M5412 Radiculopathy, cervical region: Secondary | ICD-10-CM | POA: Diagnosis not present

## 2017-08-12 DIAGNOSIS — R202 Paresthesia of skin: Secondary | ICD-10-CM | POA: Diagnosis not present

## 2017-08-24 DIAGNOSIS — M533 Sacrococcygeal disorders, not elsewhere classified: Secondary | ICD-10-CM | POA: Diagnosis not present

## 2017-11-21 DIAGNOSIS — Z01419 Encounter for gynecological examination (general) (routine) without abnormal findings: Secondary | ICD-10-CM | POA: Diagnosis not present

## 2017-11-21 DIAGNOSIS — Z6832 Body mass index (BMI) 32.0-32.9, adult: Secondary | ICD-10-CM | POA: Diagnosis not present

## 2017-11-21 DIAGNOSIS — Z1231 Encounter for screening mammogram for malignant neoplasm of breast: Secondary | ICD-10-CM | POA: Diagnosis not present

## 2017-12-14 DIAGNOSIS — G4733 Obstructive sleep apnea (adult) (pediatric): Secondary | ICD-10-CM | POA: Diagnosis not present

## 2018-01-09 ENCOUNTER — Other Ambulatory Visit: Payer: Self-pay | Admitting: Gastroenterology

## 2018-01-09 DIAGNOSIS — R1310 Dysphagia, unspecified: Secondary | ICD-10-CM

## 2018-01-11 ENCOUNTER — Other Ambulatory Visit: Payer: BLUE CROSS/BLUE SHIELD

## 2018-01-17 ENCOUNTER — Ambulatory Visit
Admission: RE | Admit: 2018-01-17 | Discharge: 2018-01-17 | Disposition: A | Discharge status: Home / Self Care | Payer: BLUE CROSS/BLUE SHIELD | Source: Ambulatory Visit | Attending: Gastroenterology | Admitting: Gastroenterology

## 2018-01-17 DIAGNOSIS — K219 Gastro-esophageal reflux disease without esophagitis: Secondary | ICD-10-CM | POA: Diagnosis not present

## 2018-01-17 DIAGNOSIS — R1310 Dysphagia, unspecified: Secondary | ICD-10-CM

## 2018-04-04 DIAGNOSIS — B078 Other viral warts: Secondary | ICD-10-CM | POA: Diagnosis not present

## 2018-04-18 DIAGNOSIS — Z13820 Encounter for screening for osteoporosis: Secondary | ICD-10-CM | POA: Diagnosis not present

## 2018-05-08 DIAGNOSIS — B078 Other viral warts: Secondary | ICD-10-CM | POA: Diagnosis not present

## 2018-05-11 DIAGNOSIS — S92532A Displaced fracture of distal phalanx of left lesser toe(s), initial encounter for closed fracture: Secondary | ICD-10-CM | POA: Diagnosis not present

## 2018-05-11 DIAGNOSIS — M79672 Pain in left foot: Secondary | ICD-10-CM | POA: Diagnosis not present

## 2018-05-11 DIAGNOSIS — M79641 Pain in right hand: Secondary | ICD-10-CM | POA: Diagnosis not present

## 2018-08-15 DIAGNOSIS — G4733 Obstructive sleep apnea (adult) (pediatric): Secondary | ICD-10-CM | POA: Diagnosis not present

## 2018-09-14 DIAGNOSIS — G4733 Obstructive sleep apnea (adult) (pediatric): Secondary | ICD-10-CM | POA: Diagnosis not present

## 2018-10-15 DIAGNOSIS — G4733 Obstructive sleep apnea (adult) (pediatric): Secondary | ICD-10-CM | POA: Diagnosis not present

## 2018-12-12 ENCOUNTER — Other Ambulatory Visit: Payer: Self-pay

## 2018-12-12 ENCOUNTER — Emergency Department: Payer: BC Managed Care – PPO

## 2018-12-12 ENCOUNTER — Emergency Department
Admission: EM | Admit: 2018-12-12 | Discharge: 2018-12-12 | Disposition: A | Discharge status: Home / Self Care | Payer: BC Managed Care – PPO | Attending: Emergency Medicine | Admitting: Emergency Medicine

## 2018-12-12 DIAGNOSIS — M79602 Pain in left arm: Secondary | ICD-10-CM | POA: Diagnosis not present

## 2018-12-12 DIAGNOSIS — R0789 Other chest pain: Secondary | ICD-10-CM | POA: Insufficient documentation

## 2018-12-12 DIAGNOSIS — R200 Anesthesia of skin: Secondary | ICD-10-CM | POA: Diagnosis not present

## 2018-12-12 DIAGNOSIS — R079 Chest pain, unspecified: Secondary | ICD-10-CM

## 2018-12-12 DIAGNOSIS — R110 Nausea: Secondary | ICD-10-CM | POA: Insufficient documentation

## 2018-12-12 DIAGNOSIS — R202 Paresthesia of skin: Secondary | ICD-10-CM | POA: Diagnosis not present

## 2018-12-12 LAB — CBC WITH DIFFERENTIAL/PLATELET
Abs Immature Granulocytes: 0.03 10*3/uL (ref 0.00–0.07)
Basophils Absolute: 0 10*3/uL (ref 0.0–0.1)
Basophils Relative: 0 %
Eosinophils Absolute: 0 10*3/uL (ref 0.0–0.5)
Eosinophils Relative: 1 %
HCT: 38.2 % (ref 36.0–46.0)
Hemoglobin: 12.3 g/dL (ref 12.0–15.0)
Immature Granulocytes: 1 %
Lymphocytes Relative: 33 %
Lymphs Abs: 1.7 10*3/uL (ref 0.7–4.0)
MCH: 29 pg (ref 26.0–34.0)
MCHC: 32.2 g/dL (ref 30.0–36.0)
MCV: 90.1 fL (ref 80.0–100.0)
Monocytes Absolute: 0.3 10*3/uL (ref 0.1–1.0)
Monocytes Relative: 6 %
Neutro Abs: 3.1 10*3/uL (ref 1.7–7.7)
Neutrophils Relative %: 59 %
Platelets: 206 10*3/uL (ref 150–400)
RBC: 4.24 MIL/uL (ref 3.87–5.11)
RDW: 14 % (ref 11.5–15.5)
WBC: 5.1 10*3/uL (ref 4.0–10.5)
nRBC: 0 % (ref 0.0–0.2)

## 2018-12-12 LAB — BASIC METABOLIC PANEL
Anion gap: 10 (ref 5–15)
BUN: 18 mg/dL (ref 6–20)
CO2: 28 mmol/L (ref 22–32)
Calcium: 9.4 mg/dL (ref 8.9–10.3)
Chloride: 103 mmol/L (ref 98–111)
Creatinine, Ser: 0.68 mg/dL (ref 0.44–1.00)
GFR calc Af Amer: >60 mL/min (ref >60)
GFR calc non Af Amer: >60 mL/min (ref >60)
Glucose, Bld: 110 mg/dL — ABNORMAL HIGH (ref 70–99)
Potassium: 3.5 mmol/L (ref 3.5–5.1)
Sodium: 141 mmol/L (ref 135–145)

## 2018-12-12 LAB — TROPONIN I (HIGH SENSITIVITY)
Troponin I (High Sensitivity): 5 ng/L (ref <18)
Troponin I (High Sensitivity): 5 ng/L (ref <18)

## 2018-12-12 NOTE — ED Provider Notes (Signed)
[facility] Emergency Department Provider Note   ____________________________________________   First MD Initiated Contact with Patient 12/12/18 1813     (approximate)  I have reviewed the triage vital signs and the nursing notes.   HISTORY  Chief Complaint Arm Pain    HPI Margaret Bennett is a 58 y.o. female who reports this morning she had some discomfort in her left arm especially in the biceps area.  It felt a little tight.  Additionally she had more numbness than usual in her left hand.  She reports she usually has numbness in the first 3 fingers of each hand with the Raynaud's that she has but this time it was the whole left hand and the right was feeling fairly good.  Then she developed some pain in the side of her chest as though her bra was pinching her.  Additionally she had some nausea after she ate lunch.  She went to see the nurse practitioner at work who sent her to [facility] who sent her here.  Here she still has some tightness in the left arm but no chest tightness no nausea.  She is never had any fever or cough or shortness of breath.  She does not have any chest discomfort with deep breathing.         Past Medical History:  Diagnosis Date  . Mass of head    forehead  . Sleep apnea    severe OSA, uses CPAP nightly    There are no active problems to display for this patient.   Past Surgical History:  Procedure Laterality Date  . ABDOMINAL HYSTERECTOMY    . CHOLECYSTECTOMY    . MASS EXCISION N/A 12/26/2015   Procedure: EXCISION SUBFACIAL MASS FOREHEAD 1CM;  Surgeon: Glenna Fellows, MD;  Location: [facility];  Service: Plastics;  Laterality: N/A;    Prior to Admission medications   Medication Sig Start Date End Date Taking? Authorizing Provider  HYDROcodone-acetaminophen (NORCO) 7.5-325 MG tablet Take 1-2 tablets by mouth every 4 (four) hours as needed (for pain score 1-4). 12/26/15   Glenna Fellows, MD     Allergies Patient has no known allergies.  No family history on file.  Social History Social History   Tobacco Use  . Smoking status: Never Smoker  . Smokeless tobacco: Never Used  Substance Use Topics  . Alcohol use: Yes    Comment: social  . Drug use: Not on file    Review of Systems  Constitutional: No fever/chills Eyes: No visual changes. ENT: No sore throat. Cardiovascular: See HPI Respiratory: Denies shortness of breath. Gastrointestinal: No abdominal pain.  No nausea, no vomiting.  No diarrhea.  No constipation. Genitourinary: Negative for dysuria. Musculoskeletal: Negative for back pain. Skin: Negative for rash. Neurological: Negative for headaches, focal weakness   ____________________________________________   PHYSICAL EXAM:  VITAL SIGNS: ED Triage Vitals  Enc Vitals Group     BP 12/12/18 1719 (!) 150/79     Pulse Rate 12/12/18 1719 76     Resp 12/12/18 1719 18     Temp 12/12/18 1719 98.8 F (37.1 C)     Temp Source 12/12/18 1719 Oral     SpO2 12/12/18 1719 100 %     Weight 12/12/18 1720 215 lb (97.5 kg)     Height 12/12/18 1720 5\' 9"  (1.753 m)     Head Circumference --      Peak Flow --      Pain Score 12/12/18  1720 6     Pain Loc --      Pain Edu? --      Excl. in [place]? --     Constitutional: Alert and oriented. Well appearing and in no acute distress. Eyes: Conjunctivae are normal.  Head: Atraumatic. Nose: No congestion/rhinnorhea. Mouth/Throat: Mucous membranes are moist.  Oropharynx non-erythematous. Neck: No stridor.  Cardiovascular: Normal rate, regular rhythm. Grossly normal heart sounds.  Good peripheral circulation. Respiratory: Normal respiratory effort.  No retractions. Lungs CTAB. Gastrointestinal: Soft and nontender. No distention. No abdominal bruits. No CVA tenderness. }Musculoskeletal: No lower extremity tenderness nor edema.  Arms look normal bilaterally.  Normal capillary refill bilaterally normal motor function bilaterally  Neurologic:  Normal speech and language. No gross focal neurologic deficits are appreciated. No gait instability. Skin:  Skin is warm, dry and intact. No rash noted.   ____________________________________________   LABS (all labs ordered are listed, but only abnormal results are displayed)  Labs Reviewed  BASIC METABOLIC PANEL - Abnormal; Notable for the following components:      Result Value   Glucose, Bld 110 (*)    All other components within normal limits  CBC WITH DIFFERENTIAL/PLATELET  TROPONIN I (HIGH SENSITIVITY)  TROPONIN I (HIGH SENSITIVITY)   ____________________________________________  EKG  EKG read interpreted by me shows normal sinus rhythm rate of 77 left axis no acute ST-T changes although there is some slightly decreased R wave progression. ____________________________________________  RADIOLOGY  ED MD interpretation: Chest x-ray read by radiology reviewed by me is negative  Official radiology report(s): Dg Chest Portable 1 View  Result Date: 12/12/2018 CLINICAL DATA:  Chest pain EXAM: PORTABLE CHEST 1 VIEW COMPARISON:  April 08, 2015 FINDINGS: The heart size and mediastinal contours are within normal limits. Both lungs are clear. The visualized skeletal structures are unremarkable. IMPRESSION: No acute cardiopulmonary process. Electronically Signed   By: Prudencio Pair M.D.   On: 12/12/2018 19:04    ____________________________________________   PROCEDURES  Procedure(s) performed (including Critical Care):  Procedures   ____________________________________________   INITIAL IMPRESSION / ASSESSMENT AND PLAN / ED COURSE  Margaret Bennett was evaluated in Emergency Department on 12/12/2018 for the symptoms described in the history of present illness. She was evaluated in the context of the global COVID-19 pandemic, which necessitated consideration that the patient might be at risk for infection with the SARS-CoV-2 virus that causes COVID-19.  Institutional protocols and algorithms that pertain to the evaluation of patients at risk for COVID-19 are in a state of rapid change based on information released by regulatory bodies including the CDC and federal and state organizations. These policies and algorithms were followed during the patient's care in the ED.    Patient's troponins are negative.  Her chest x-ray is clear her EKG does not show any acute changes.  Not sure what made her have the arm and chest discomfort however I will let her go and follow-up with cardiology.  Dr. end is on call for [facility].  I will have her return if she is worse.         ____________________________________________   FINAL CLINICAL IMPRESSION(S) / ED DIAGNOSES  Final diagnoses:  Chest pain, unspecified type     ED Discharge Orders    None       Note:  This document was prepared using Dragon voice recognition software and may include unintentional dictation errors.    Nena Polio, MD 12/12/18 2030

## 2018-12-12 NOTE — Discharge Instructions (Signed)
The blood work EKG and lab test that we have done today do not show any problems with your heart or lungs.  However just to be safe I will have you follow-up with cardiology.  Please give Dr. Darnelle Bos office a call in the morning.  Let them know you are in the emergency room with arm and chest pain.  They should be out of follow-up with you fairly quickly.  Please return here if you get worse.  This includes worsening pain shortness of breath fever chills or any other problems.

## 2018-12-12 NOTE — ED Notes (Addendum)
Pt reports left arm pain x2 days without any other sxs - she denies chest pain or SHOB - she reports that left arm pain has resolved and that the arm just feels "tingly"

## 2018-12-21 NOTE — Progress Notes (Signed)
Cardiology Office Note    Date:  12/27/2018   ID:  Margaret Bennett, DOB July 03, 1960, MRN [id number]  PCP:  System, Pcp Not In  Cardiologist:  New to [facility] Electrophysiologist:  None   Chief Complaint: ED follow up  History of Present Illness:   Margaret Bennett is a 58 y.o. female with history of Raynaud's syndrome, sleep apnea on CPAP, benign subfacial mass s/p excision in 2017, and cervical radiculopathy who presents for ED follow up of chest pain.   She is without any previously known cardiac history.  She indicates undergoing a nuclear stress test approximately 12 years ago in [place] that she reports was unrevealing.  She states symptoms were attributed to underlying anxiety at that time.  She was seen in the ED on 12/12/2018 with left biceps discomfort, numbness in her left hand and pain in the side of her chest that felt like "her bra was pinching her." Work up in the ED showed an initial high sensitivity troponin of 5 with a delta of 5, EKG with NSR, 77 bpm, poor R wave progression along the precordial leads, no acute st/t changes, CXR without acute cardiopulmonary process. BP mildly elevated at 150/79. She was advised to follow up with cardiology.   She comes in today doing well.  She indicates over the past month she has noted some increased exertional dyspnea which is new for her.  She states she has noted shortness of breath requiring her to stop and rest even while ambulating on flat ground.  She has also noted having to stop and rest while walking to and from the mailbox at her house.  On 10/6 she developed a sharp left bicep pain while at work that was nonexertional.  Pain that radiated towards her shoulder and along the left side of her chest and was described as "like my bra was pinching me."  Pain has been sharp.  Movement of the arm and palpation seems to improve her discomfort.  No associated palpitations, dizziness, presyncope, syncope, lower extremity swelling,  abdominal tension, orthopnea, PND, early satiety.  She has been compliant with her CPAP over the past 5 years.  She has continued to note this sharp left arm and shoulder discomfort since her visit to the ED as above.  She reports her father was diagnosed with CAD at age 70 and underwent multivessel bypass as well as BiV ICD implantation at that time.  She has several siblings without any known cardiac history.  She was never a smoker, does not have diabetes, hypertension, or hyperlipidemia.  She denies any illicit drugs.   Past Medical History:  Diagnosis Date   Mass of head    forehead   Sleep apnea    severe OSA, uses CPAP nightly    Past Surgical History:  Procedure Laterality Date   ABDOMINAL HYSTERECTOMY     CHOLECYSTECTOMY     MASS EXCISION N/A 12/26/2015   Procedure: EXCISION SUBFACIAL MASS FOREHEAD 1CM;  Surgeon: Irene Limbo, MD;  Location: [place];  Service: Plastics;  Laterality: N/A;    Current Medications: No outpatient medications have been marked as taking for the 12/27/18 encounter (Office Visit) with Rise Mu, PA-C.    Allergies:   Patient has no known allergies.   Social History   Socioeconomic History   Marital status: Married    Spouse name: Not on file   Number of children: Not on file   Years of education: Not on  file   Highest education level: Not on file  Occupational History   Not on file  Social Needs   Financial resource strain: Not on file   Food insecurity    Worry: Not on file    Inability: Not on file   Transportation needs    Medical: Not on file    Non-medical: Not on file  Tobacco Use   Smoking status: Never Smoker   Smokeless tobacco: Never Used  Substance and Sexual Activity   Alcohol use: Yes    Comment: social   Drug use: Never   Sexual activity: Yes    Birth control/protection: Surgical  Lifestyle   Physical activity    Days per week: Not on file    Minutes per session: Not on  file   Stress: Not on file  Relationships   Social connections    Talks on phone: Not on file    Gets together: Not on file    Attends religious service: Not on file    Active member of club or organization: Not on file    Attends meetings of clubs or organizations: Not on file    Relationship status: Not on file  Other Topics Concern   Not on file  Social History Narrative   Not on file     Family History:  The patient's family history includes Arrhythmia in her father; Arthritis/Rheumatoid in her mother; Heart disease in her father; Lupus in her mother.  ROS:   Review of Systems  Constitutional: Positive for malaise/fatigue. Negative for chills, diaphoresis, fever and weight loss.  HENT: Negative for congestion.   Eyes: Negative for discharge and redness.  Respiratory: Positive for shortness of breath. Negative for cough, hemoptysis, sputum production and wheezing.   Cardiovascular: Positive for chest pain. Negative for palpitations, orthopnea, claudication, leg swelling and PND.  Gastrointestinal: Negative for abdominal pain, blood in stool, heartburn, melena, nausea and vomiting.  Genitourinary: Negative for hematuria.  Musculoskeletal: Positive for joint pain. Negative for falls and myalgias.  Skin: Negative for rash.  Neurological: Negative for dizziness, tingling, tremors, sensory change, speech change, focal weakness, loss of consciousness and weakness.  Endo/Heme/Allergies: Does not bruise/bleed easily.  Psychiatric/Behavioral: Negative for substance abuse. The patient is not nervous/anxious.   All other systems reviewed and are negative.    EKGs/Labs/Other Studies Reviewed:    Studies reviewed were summarized above. The additional studies were reviewed today: None on file.   EKG:  EKG is ordered today.  The EKG ordered today demonstrates NSR, 89 bpm, low voltage QRS, no acute ST-T changes  Recent Labs: 12/12/2018: BUN 18; Creatinine, Ser 0.68; Hemoglobin 12.3;  Platelets 206; Potassium 3.5; Sodium 141  Recent Lipid Panel No results found for: CHOL, TRIG, HDL, CHOLHDL, VLDL, LDLCALC, LDLDIRECT  PHYSICAL EXAM:    VS:  BP 126/80 (BP Location: Left Arm, Patient Position: Sitting, Cuff Size: Normal)    Pulse 89    Temp 97.9 F (36.6 C)    Ht 5\' 10"  (1.778 m)    Wt 229 lb (103.9 kg)    SpO2 99%    BMI 32.86 kg/m   BMI: Body mass index is 32.86 kg/m.  Physical Exam  Constitutional: She is oriented to person, place, and time. She appears well-developed and well-nourished.  HENT:  Head: Normocephalic and atraumatic.  Eyes: Right eye exhibits no discharge. Left eye exhibits no discharge.  Neck: Normal range of motion. No JVD present.  Cardiovascular: Normal rate, regular rhythm, S1 normal, S2 normal  and normal heart sounds. Exam reveals no distant heart sounds, no friction rub, no midsystolic click and no opening snap.  No murmur heard. Pulses:      Posterior tibial pulses are 2+ on the right side and 2+ on the left side.  Palpation of the left lateral chest improves her discomfort  Pulmonary/Chest: Effort normal and breath sounds normal. No respiratory distress. She has no decreased breath sounds. She has no wheezes. She has no rales. She exhibits no tenderness.  Abdominal: Soft. She exhibits no distension. There is no abdominal tenderness.  Musculoskeletal:        General: No edema.  Neurological: She is alert and oriented to person, place, and time.  Skin: Skin is warm and dry. No cyanosis. Nails show no clubbing.  Psychiatric: She has a normal mood and affect. Her speech is normal and behavior is normal. Judgment and thought content normal.    Wt Readings from Last 3 Encounters:  12/27/18 229 lb (103.9 kg)  12/12/18 215 lb (97.5 kg)  12/26/15 210 lb (95.3 kg)     ASSESSMENT & PLAN:   1. Chest pain with moderate risk for cardiac etiology/exertional dyspnea: Symptoms are overall atypical.  Her main risk factor for coronary disease at this  time would be weight.  She does not have any history of diabetes, hypertension, hyperlipidemia, or prior tobacco abuse.  No family history of premature coronary disease.  Work-up in the ED was unrevealing as outlined above.  Given her exertional dyspnea we have agreed to proceed with an echocardiogram to evaluate LV systolic function, valvular function, wall motion, and right-sided pressure given her known sleep apnea.  No known risk factors for PE with nontachycardic rate and pulse ox of 99% on room air in the office today.  Proceed with coronary CTA.  Recommend continued primary prevention.  2. Sleep apnea: Continue CPAP as directed by prescribing provider.  Disposition: F/u with Dr. Azucena CecilAgbor-Etang in 4 weeks to establish care.   Medication Adjustments/Labs and Tests Ordered: Current medicines are reviewed at length with the patient today.  Concerns regarding medicines are outlined above. Medication changes, Labs and Tests ordered today are summarized above and listed in the Patient Instructions accessible in Encounters.   Signed, Eula Listenyan Samadhi Mahurin, PA-C 12/27/2018 8:37 AM     [facility] [address] [place], [place] [phone]

## 2018-12-27 ENCOUNTER — Ambulatory Visit (INDEPENDENT_AMBULATORY_CARE_PROVIDER_SITE_OTHER): Payer: BC Managed Care – PPO | Admitting: Physician Assistant

## 2018-12-27 ENCOUNTER — Other Ambulatory Visit: Payer: Self-pay

## 2018-12-27 ENCOUNTER — Encounter: Payer: Self-pay | Admitting: Physician Assistant

## 2018-12-27 VITALS — BP 126/80 | HR 89 | Temp 97.9°F | Ht 70.0 in | Wt 229.0 lb

## 2018-12-27 DIAGNOSIS — R0609 Other forms of dyspnea: Secondary | ICD-10-CM

## 2018-12-27 DIAGNOSIS — R0600 Dyspnea, unspecified: Secondary | ICD-10-CM

## 2018-12-27 DIAGNOSIS — G4733 Obstructive sleep apnea (adult) (pediatric): Secondary | ICD-10-CM

## 2018-12-27 DIAGNOSIS — Z9989 Dependence on other enabling machines and devices: Secondary | ICD-10-CM

## 2018-12-27 DIAGNOSIS — R079 Chest pain, unspecified: Secondary | ICD-10-CM | POA: Diagnosis not present

## 2018-12-27 MED ORDER — METOPROLOL TARTRATE 100 MG PO TABS: 100.0000 mg | ORAL_TABLET | Freq: Two times a day (BID) | ORAL | 0 refills | Status: DC

## 2018-12-27 NOTE — Patient Instructions (Signed)
Medication Instructions:   1. LOPRESSOR 100MG  will be sent in for you to take 2 hours prior to your Cardiac CTA  *If you need a refill on your cardiac medications before your next appointment, please call your pharmacy*  Lab Work:  1. None Ordered  If you have labs (blood work) drawn today and your tests are completely normal, you will receive your results only by: Marland Kitchen MyChart Message (if you have MyChart) OR . A paper copy in the mail If you have any lab test that is abnormal or we need to change your treatment, we will call you to review the results.  Testing/Procedures:  1. Your cardiac CT will be scheduled at one of the below locations:   [facility] [address] [place], [place] [address] ([phone] [place] [address] [place], [place] [address] [phone]  If scheduled at [facility], please arrive at the [facility] main entrance of [facility] 30-45 minutes prior to test start time. Proceed to the [facility] Radiology Department (first floor) to check-in and test prep.  If scheduled at [facility], please arrive 15 mins early for check-in and test prep.  Please follow these instructions carefully (unless otherwise directed):   On the Night Before the Test: . Be sure to Drink plenty of water. . Do not consume any caffeinated/decaffeinated beverages or chocolate 12 hours prior to your test. . Do not take any antihistamines 12 hours prior to your test  On the Day of the Test: . Drink plenty of water. Do not drink any water within one hour of the test. . Do not eat any food 4 hours prior to the test. . You may take your regular medications prior to the test.  . Take metoprolol (Lopressor) two hours prior to test. . FEMALES- please wear underwire-free bra if available  After the Test: . Drink plenty of water. . After receiving IV contrast, you  may experience a mild flushed feeling. This is normal. . On occasion, you may experience a mild rash up to 24 hours after the test. This is not dangerous. If this occurs, you can take Benadryl 25 mg and increase your fluid intake. . If you experience trouble breathing, this can be serious. If it is severe call 911 IMMEDIATELY. If it is mild, please call our office.   Please contact the cardiac imaging nurse navigator should you have any questions/concerns Marchia Bond, RN Navigator Cardiac Imaging [facility] [phone] Office   2. Echocardiogram Your physician has requested that you have an echocardiogram. Echocardiography is a painless test that uses sound waves to create images of your heart. It provides your doctor with information about the size and shape of your heart and how well your heart's chambers and valves are working. This procedure takes approximately one hour. There are no restrictions for this procedure. Please note; depending on visual quality an IV may need to be placed.     Follow-Up: At [facility], you and your health needs are our priority.  As part of our continuing mission to provide you with exceptional heart care, we have created designated Provider Care Teams.  These Care Teams include your primary Cardiologist (physician) and Advanced Practice Providers (APPs -  Physician Assistants and Nurse Practitioners) who all work together to provide you with the care you need, when you need it.  Your next appointment:  1 month  The format for your next appointment:   In Person  Provider:   Debbe Odea, MD

## 2019-01-04 ENCOUNTER — Telehealth: Payer: Self-pay | Admitting: Cardiology

## 2019-01-04 NOTE — Telephone Encounter (Signed)
Margaret Bennett- do you know if she is still be precerted?  Thanks!

## 2019-01-04 NOTE — Telephone Encounter (Signed)
Patient calling to check on status  Patient is awaiting CTA scheduling and it has been over a week  Please call to discuss

## 2019-01-05 NOTE — Telephone Encounter (Signed)
I spoke with the patient and advised her Cardiac CT is still in the precert process and she will be notified to scheduled once this is approved.   She just wanted to make sure this was done prior to her follow up with Dr. Garen Lah.  I advised I will keep an eye for when her Cardiac CT is scheduled and if it after 11/30 for some reason, we will need to push out her appt with Dr. Garen Lah.  The patient voices understanding of the above and is agreeable.

## 2019-01-05 NOTE — Telephone Encounter (Signed)
Demetrios Loll, RN        No she is still pending with the Precert

## 2019-01-10 ENCOUNTER — Ambulatory Visit (INDEPENDENT_AMBULATORY_CARE_PROVIDER_SITE_OTHER): Payer: BC Managed Care – PPO

## 2019-01-10 ENCOUNTER — Other Ambulatory Visit: Payer: Self-pay

## 2019-01-10 DIAGNOSIS — R0600 Dyspnea, unspecified: Secondary | ICD-10-CM | POA: Diagnosis not present

## 2019-01-10 DIAGNOSIS — R0609 Other forms of dyspnea: Secondary | ICD-10-CM

## 2019-01-11 ENCOUNTER — Telehealth: Payer: Self-pay

## 2019-01-11 NOTE — Telephone Encounter (Signed)
Returned call to patient to review results. Pt verbalized understanding and was inquiring when CT would take place.   I let her know that I would reach out to scheduling and make sure they had everything they need.   Advised pt to call for any further questions or concerns.

## 2019-01-11 NOTE — Telephone Encounter (Signed)
Attempted to call patient. LMTCB 01/11/2019

## 2019-01-11 NOTE — Telephone Encounter (Signed)
-----   Message from Rise Mu, PA-C sent at 01/10/2019  3:09 PM EST ----- Echo showed normal pump function, borderline thickening of the left ventricle, slightly stiffened heart, mildly leaky aortic valve (trileaflet aortic valve), mildly dilated aortic root.   Optimal BP and HR control recommended, both were reasonably controlled at her visit.  Mildly leaky aortic valve along with mildly dilated aortic root are incidental and can be followed with periodic echo in ~ 12 months. The aorta will be further evaluated on coronary CTA which is pending.

## 2019-01-11 NOTE — Telephone Encounter (Signed)
-----   Message from Ryan M Dunn, PA-C sent at 01/10/2019  3:09 PM EST ----- Echo showed normal pump function, borderline thickening of the left ventricle, slightly stiffened heart, mildly leaky aortic valve (trileaflet aortic valve), mildly dilated aortic root.   Optimal BP and HR control recommended, both were reasonably controlled at her visit.  Mildly leaky aortic valve along with mildly dilated aortic root are incidental and can be followed with periodic echo in ~ 12 months. The aorta will be further evaluated on coronary CTA which is pending. 

## 2019-01-22 ENCOUNTER — Telehealth (HOSPITAL_COMMUNITY): Payer: Self-pay | Admitting: Emergency Medicine

## 2019-01-22 NOTE — Telephone Encounter (Signed)
Reaching out to patient to offer assistance regarding upcoming cardiac imaging study; pt verbalizes understanding of appt date/time, parking situation and where to check in, pre-test NPO status and medications ordered, and verified current allergies; name and call back number provided for further questions should they arise Lambros Cerro RN Navigator Cardiac Imaging [facility] [phone] office [phone] cell 

## 2019-01-24 ENCOUNTER — Ambulatory Visit
Admission: RE | Admit: 2019-01-24 | Discharge: 2019-01-24 | Disposition: A | Discharge status: Home / Self Care | Payer: BC Managed Care – PPO | Source: Ambulatory Visit | Attending: Physician Assistant | Admitting: Physician Assistant

## 2019-01-24 ENCOUNTER — Other Ambulatory Visit: Payer: Self-pay

## 2019-01-24 DIAGNOSIS — R0600 Dyspnea, unspecified: Secondary | ICD-10-CM | POA: Diagnosis not present

## 2019-01-24 DIAGNOSIS — R0609 Other forms of dyspnea: Secondary | ICD-10-CM

## 2019-01-24 LAB — POCT I-STAT CREATININE: Creatinine, Ser: 0.7 mg/dL (ref 0.44–1.00)

## 2019-01-24 MED ORDER — METOPROLOL TARTRATE 5 MG/5ML IV SOLN: 5.0000 mg | INTRAVENOUS | Status: DC | PRN

## 2019-01-24 MED ORDER — IOHEXOL 350 MG/ML SOLN
100.0000 mL | Freq: Once | INTRAVENOUS | Status: AC | PRN
  Administered 2019-01-24: 100 mL via INTRAVENOUS

## 2019-01-24 MED ORDER — NITROGLYCERIN 0.4 MG SL SUBL
0.8000 mg | SUBLINGUAL_TABLET | Freq: Once | SUBLINGUAL | Status: AC
  Administered 2019-01-24: 0.8 mg via SUBLINGUAL

## 2019-01-24 NOTE — Progress Notes (Signed)
Pt tolerated test without incident; pt denies lightheadedness or dizziness; refused refreshments; pt ambulatory to lobby with steady gait noted

## 2019-01-26 ENCOUNTER — Telehealth: Payer: Self-pay

## 2019-01-26 NOTE — Telephone Encounter (Signed)
-----   Message from Rise Mu, [place] sent at 01/25/2019  2:34 PM EST ----- Cardiac portion of coronary CTA: Calcium score of 0 without any significant stenosis involving the coronary arteries.  Mild atherosclerosis of the descending aorta.  Chest pain is noncardiac.  We do not have a recent fasting lipid panel on file for her. I would like for her to obtain a fasting lipid panel and liver function at her convenience given the mild aortic atherosclerosis noted. She can discuss this with Dr. Garen Lah when she sees him in follow up.    Please see result note regarding noncardiac portion as well.

## 2019-01-26 NOTE — Telephone Encounter (Signed)
Call to patient to discuss results from CT and ongoing POC.   Pt verbalized understanding and all questions were answered.   No further orders at this time.   Pt to f/u with PCP regarding non cardiac findings. She reported that cholesterol labs would be done through her office and they will fax results.   Pt expresses that she would like to cancel f/u at this time. appt cancelled.

## 2019-01-30 DIAGNOSIS — N644 Mastodynia: Secondary | ICD-10-CM | POA: Diagnosis not present

## 2019-02-05 ENCOUNTER — Ambulatory Visit: Payer: BC Managed Care – PPO | Admitting: Cardiology

## 2019-03-05 ENCOUNTER — Other Ambulatory Visit: Payer: Self-pay

## 2019-03-05 ENCOUNTER — Ambulatory Visit
Admission: EM | Admit: 2019-03-05 | Discharge: 2019-03-05 | Disposition: A | Discharge status: Home / Self Care | Payer: BC Managed Care – PPO

## 2019-03-05 ENCOUNTER — Encounter: Payer: Self-pay | Admitting: Emergency Medicine

## 2019-03-05 DIAGNOSIS — H00012 Hordeolum externum right lower eyelid: Secondary | ICD-10-CM

## 2019-03-05 DIAGNOSIS — H1031 Unspecified acute conjunctivitis, right eye: Secondary | ICD-10-CM

## 2019-03-05 DIAGNOSIS — R030 Elevated blood-pressure reading, without diagnosis of hypertension: Secondary | ICD-10-CM

## 2019-03-05 MED ORDER — POLYMYXIN B-TRIMETHOPRIM 10000-0.1 UNIT/ML-% OP SOLN: 1.0000 [drp] | Freq: Four times a day (QID) | OPHTHALMIC | 0 refills | Status: AC

## 2019-03-05 NOTE — Discharge Instructions (Addendum)
Use the eyedrops as directed.  Apply warm compresses.  Follow up with an eye care provider if your symptoms are not improving.    Go to the emergency department if you have acute eye pain or changes in your vision.    Your blood pressure is elevated today at 154/92.  Please have this rechecked by your primary care provider in 2-4 weeks.

## 2019-03-05 NOTE — ED Provider Notes (Signed)
Renaldo Fiddler    CSN: [id number] Arrival date & time: 03/05/19  1403      History   Chief Complaint Chief Complaint  Patient presents with  . Facial Swelling    HPI Margaret Bennett is a 58 y.o. female.  Patient presents with right lower eyelid redness and swelling x 1 day.  She reports crusting of her eyelids this morning.  She denies acute eye pain or changes in her vision.  She denies eye injury.  She denies fever, chills, congestion, sore throat, cough, shortness of breath, or other symptoms.  No treatments attempted at home.  The history is provided by the patient.    Past Medical History:  Diagnosis Date  . Mass of head    forehead  . Sleep apnea    severe OSA, uses CPAP nightly    There are no problems to display for this patient.   Past Surgical History:  Procedure Laterality Date  . ABDOMINAL HYSTERECTOMY    . CHOLECYSTECTOMY    . MASS EXCISION N/A 12/26/2015   Procedure: EXCISION SUBFACIAL MASS FOREHEAD 1CM;  Surgeon: Glenna Fellows, MD;  Location: [facility];  Service: Plastics;  Laterality: N/A;    OB History   No obstetric history on file.      Home Medications    Prior to Admission medications   Medication Sig Start Date End Date Taking? Authorizing Provider  HYDROcodone-acetaminophen (NORCO) 7.5-325 MG tablet Take 1-2 tablets by mouth every 4 (four) hours as needed (for pain score 1-4). Patient not taking: Reported on 12/27/2018 12/26/15   Glenna Fellows, MD  metoprolol tartrate (LOPRESSOR) 100 MG tablet Take 1 tablet (100 mg total) by mouth 2 (two) times daily for 1 dose. Take 1 tablet bu mouth 2 hours before Cardiac CTA 12/27/18 12/28/18  Sondra Barges, PA-C  trimethoprim-polymyxin b (POLYTRIM) ophthalmic solution Place 1 drop into the right eye 4 (four) times daily for 7 days. 03/05/19 03/12/19  Mickie Bail, NP  valACYclovir (VALTREX) 1000 MG tablet Take 1,000 mg by mouth 3 (three) times daily. 01/19/19   [provider]    Family History Family History  Problem Relation Age of Onset  . Lupus Mother   . Arthritis/Rheumatoid Mother   . Heart disease Father   . Arrhythmia Father     Social History Social History   Tobacco Use  . Smoking status: Never Smoker  . Smokeless tobacco: Never Used  Substance Use Topics  . Alcohol use: Yes    Comment: social  . Drug use: Never     Allergies   Patient has no known allergies.   Review of Systems Review of Systems  Constitutional: Negative for chills and fever.  HENT: Negative for ear pain and sore throat.   Eyes: Positive for redness. Negative for pain and visual disturbance.  Respiratory: Negative for cough and shortness of breath.   Cardiovascular: Negative for chest pain and palpitations.  Gastrointestinal: Negative for abdominal pain and vomiting.  Genitourinary: Negative for dysuria and hematuria.  Musculoskeletal: Negative for arthralgias and back pain.  Skin: Negative for color change and rash.  Neurological: Negative for seizures and syncope.  All other systems reviewed and are negative.    Physical Exam Triage Vital Signs ED Triage Vitals  Enc Vitals Group     BP      Pulse      Resp      Temp      Temp src  SpO2      Weight      Height      Head Circumference      Peak Flow      Pain Score      Pain Loc      Pain Edu?      Excl. in GC?    No data found.  Updated Vital Signs BP (!) 154/92   Pulse 86   Temp 99.2 F (37.3 C)   Resp 18   Wt 207 lb (93.9 kg)   SpO2 95%   BMI 29.70 kg/m   Visual Acuity Right Eye Distance: 20/40 Left Eye Distance: 20/40 Bilateral Distance: 20/20  Right Eye Near:   Left Eye Near:    Bilateral Near:     Physical Exam Vitals and nursing note reviewed.  Constitutional:      General: She is not in acute distress.    Appearance: She is well-developed. She is not ill-appearing.  HENT:     Head: Normocephalic and atraumatic.     Right Ear: Tympanic membrane  normal.     Left Ear: Tympanic membrane normal.     Nose: Nose normal.     Mouth/Throat:     Mouth: Mucous membranes are moist.     Pharynx: Oropharynx is clear.  Eyes:     General: Vision grossly intact.        Right eye: Hordeolum present. No discharge.        Left eye: No discharge.     Extraocular Movements: Extraocular movements intact.     Conjunctiva/sclera:     Right eye: Right conjunctiva is injected.     Pupils: Pupils are equal, round, and reactive to light.      Comments: Mild right lower eyelid edema.   Cardiovascular:     Rate and Rhythm: Normal rate and regular rhythm.     Heart sounds: No murmur.  Pulmonary:     Effort: Pulmonary effort is normal. No respiratory distress.     Breath sounds: Normal breath sounds.  Abdominal:     General: Bowel sounds are normal.     Palpations: Abdomen is soft.     Tenderness: There is no abdominal tenderness. There is no guarding or rebound.  Musculoskeletal:     Cervical back: Neck supple.  Skin:    General: Skin is warm and dry.     Findings: No rash.  Neurological:     General: No focal deficit present.     Mental Status: She is alert and oriented to person, place, and time.  Psychiatric:        Mood and Affect: Mood normal.        Behavior: Behavior normal.      UC Treatments / Results  Labs (all labs ordered are listed, but only abnormal results are displayed) Labs Reviewed - No data to display  EKG   Radiology No results found.  Procedures Procedures (including critical care time)  Medications Ordered in UC Medications - No data to display  Initial Impression / Assessment and Plan / UC Course  I have reviewed the triage vital signs and the nursing notes.  Pertinent labs & imaging results that were available during my care of the patient were reviewed by me and considered in my medical decision making (see chart for details).   Right lower eyelid hordeolum.  Right conjunctivitis.  Treating with  Polytrim eyedrops and warm compresses.  Instructed patient to go to the ED if she  has acute eye pain or change in her vision.  Instructed her to follow-up with eye care provider if her symptoms are not improving.  Discussed with patient that her blood pressure is elevated today needs to be rechecked by her PCP in 2 to 4 weeks.  Patient agrees with plan of care.  Final Clinical Impressions(s) / UC Diagnoses   Final diagnoses:  Hordeolum externum of right lower eyelid  Acute conjunctivitis of right eye, unspecified acute conjunctivitis type  Elevated blood pressure reading     Discharge Instructions     Use the eyedrops as directed.  Apply warm compresses.  Follow up with an eye care provider if your symptoms are not improving.    Go to the emergency department if you have acute eye pain or changes in your vision.    Your blood pressure is elevated today at 154/92.  Please have this rechecked by your primary care provider in 2-4 weeks.        ED Prescriptions    Medication Sig Dispense Auth. Provider   trimethoprim-polymyxin b (POLYTRIM) ophthalmic solution Place 1 drop into the right eye 4 (four) times daily for 7 days. 10 mL Sharion Balloon, NP     PDMP not reviewed this encounter.   Sharion Balloon, NP 03/05/19 1447

## 2019-03-05 NOTE — ED Triage Notes (Signed)
Patient in office today c/o right facial swelling w/pain and redness in right eye  [id number]

## 2019-06-07 DIAGNOSIS — H608X2 Other otitis externa, left ear: Secondary | ICD-10-CM | POA: Diagnosis not present

## 2019-06-28 DIAGNOSIS — M79642 Pain in left hand: Secondary | ICD-10-CM | POA: Diagnosis not present

## 2019-07-11 ENCOUNTER — Other Ambulatory Visit: Payer: Self-pay | Admitting: Physician Assistant

## 2019-07-11 DIAGNOSIS — Z1231 Encounter for screening mammogram for malignant neoplasm of breast: Secondary | ICD-10-CM

## 2019-08-13 DIAGNOSIS — Z6835 Body mass index (BMI) 35.0-35.9, adult: Secondary | ICD-10-CM | POA: Diagnosis not present

## 2019-08-13 DIAGNOSIS — Z1231 Encounter for screening mammogram for malignant neoplasm of breast: Secondary | ICD-10-CM | POA: Diagnosis not present

## 2019-08-13 DIAGNOSIS — Z01419 Encounter for gynecological examination (general) (routine) without abnormal findings: Secondary | ICD-10-CM | POA: Diagnosis not present

## 2019-08-13 DIAGNOSIS — B372 Candidiasis of skin and nail: Secondary | ICD-10-CM | POA: Diagnosis not present

## 2019-09-18 DIAGNOSIS — J209 Acute bronchitis, unspecified: Secondary | ICD-10-CM | POA: Diagnosis not present

## 2020-02-01 DIAGNOSIS — S8265XA Nondisplaced fracture of lateral malleolus of left fibula, initial encounter for closed fracture: Secondary | ICD-10-CM | POA: Diagnosis not present

## 2020-02-01 DIAGNOSIS — Y9301 Activity, walking, marching and hiking: Secondary | ICD-10-CM | POA: Diagnosis not present

## 2020-02-01 DIAGNOSIS — M25572 Pain in left ankle and joints of left foot: Secondary | ICD-10-CM | POA: Diagnosis not present

## 2020-02-01 DIAGNOSIS — X501XXA Overexertion from prolonged static or awkward postures, initial encounter: Secondary | ICD-10-CM | POA: Diagnosis not present

## 2020-02-05 DIAGNOSIS — S8262XA Displaced fracture of lateral malleolus of left fibula, initial encounter for closed fracture: Secondary | ICD-10-CM | POA: Diagnosis not present

## 2020-02-28 DIAGNOSIS — M25572 Pain in left ankle and joints of left foot: Secondary | ICD-10-CM | POA: Diagnosis not present

## 2020-03-21 DIAGNOSIS — R030 Elevated blood-pressure reading, without diagnosis of hypertension: Secondary | ICD-10-CM | POA: Diagnosis not present

## 2020-03-21 DIAGNOSIS — G5622 Lesion of ulnar nerve, left upper limb: Secondary | ICD-10-CM | POA: Diagnosis not present

## 2020-03-21 DIAGNOSIS — M5412 Radiculopathy, cervical region: Secondary | ICD-10-CM | POA: Diagnosis not present

## 2020-03-21 DIAGNOSIS — Z6831 Body mass index (BMI) 31.0-31.9, adult: Secondary | ICD-10-CM | POA: Diagnosis not present

## 2020-03-27 DIAGNOSIS — S8262XD Displaced fracture of lateral malleolus of left fibula, subsequent encounter for closed fracture with routine healing: Secondary | ICD-10-CM | POA: Diagnosis not present

## 2020-04-22 DIAGNOSIS — G5622 Lesion of ulnar nerve, left upper limb: Secondary | ICD-10-CM | POA: Diagnosis not present

## 2020-04-22 DIAGNOSIS — Z6833 Body mass index (BMI) 33.0-33.9, adult: Secondary | ICD-10-CM | POA: Diagnosis not present

## 2020-04-22 DIAGNOSIS — G5603 Carpal tunnel syndrome, bilateral upper limbs: Secondary | ICD-10-CM | POA: Diagnosis not present

## 2020-04-24 DIAGNOSIS — S8262XD Displaced fracture of lateral malleolus of left fibula, subsequent encounter for closed fracture with routine healing: Secondary | ICD-10-CM | POA: Diagnosis not present

## 2020-05-07 DIAGNOSIS — R030 Elevated blood-pressure reading, without diagnosis of hypertension: Secondary | ICD-10-CM | POA: Diagnosis not present

## 2020-05-07 DIAGNOSIS — Z6833 Body mass index (BMI) 33.0-33.9, adult: Secondary | ICD-10-CM | POA: Diagnosis not present

## 2020-05-07 DIAGNOSIS — G5603 Carpal tunnel syndrome, bilateral upper limbs: Secondary | ICD-10-CM | POA: Diagnosis not present

## 2020-06-05 DIAGNOSIS — G5603 Carpal tunnel syndrome, bilateral upper limbs: Secondary | ICD-10-CM | POA: Diagnosis not present

## 2020-08-18 DIAGNOSIS — J069 Acute upper respiratory infection, unspecified: Secondary | ICD-10-CM | POA: Diagnosis not present

## 2020-10-14 DIAGNOSIS — M19072 Primary osteoarthritis, left ankle and foot: Secondary | ICD-10-CM | POA: Diagnosis not present

## 2020-11-26 DIAGNOSIS — Z6836 Body mass index (BMI) 36.0-36.9, adult: Secondary | ICD-10-CM | POA: Diagnosis not present

## 2020-11-26 DIAGNOSIS — Z01419 Encounter for gynecological examination (general) (routine) without abnormal findings: Secondary | ICD-10-CM | POA: Diagnosis not present

## 2020-11-26 DIAGNOSIS — Z1231 Encounter for screening mammogram for malignant neoplasm of breast: Secondary | ICD-10-CM | POA: Diagnosis not present

## 2020-12-22 DIAGNOSIS — Z01818 Encounter for other preprocedural examination: Secondary | ICD-10-CM | POA: Diagnosis not present

## 2020-12-29 DIAGNOSIS — J069 Acute upper respiratory infection, unspecified: Secondary | ICD-10-CM | POA: Diagnosis not present

## 2020-12-29 DIAGNOSIS — R509 Fever, unspecified: Secondary | ICD-10-CM | POA: Diagnosis not present

## 2021-12-23 DIAGNOSIS — G4733 Obstructive sleep apnea (adult) (pediatric): Secondary | ICD-10-CM | POA: Diagnosis not present

## 2022-01-23 DIAGNOSIS — G4733 Obstructive sleep apnea (adult) (pediatric): Secondary | ICD-10-CM | POA: Diagnosis not present

## 2022-02-22 ENCOUNTER — Ambulatory Visit (INDEPENDENT_AMBULATORY_CARE_PROVIDER_SITE_OTHER): Payer: BC Managed Care – PPO

## 2022-02-22 ENCOUNTER — Ambulatory Visit
Admission: EM | Admit: 2022-02-22 | Discharge: 2022-02-22 | Disposition: A | Discharge status: Home / Self Care | Payer: BC Managed Care – PPO | Attending: Urgent Care | Admitting: Urgent Care

## 2022-02-22 DIAGNOSIS — G4733 Obstructive sleep apnea (adult) (pediatric): Secondary | ICD-10-CM | POA: Diagnosis not present

## 2022-02-22 DIAGNOSIS — R062 Wheezing: Secondary | ICD-10-CM | POA: Diagnosis not present

## 2022-02-22 MED ORDER — PREDNISONE 20 MG PO TABS: 60.0000 mg | ORAL_TABLET | Freq: Every day | ORAL | 0 refills | Status: AC

## 2022-02-22 MED ORDER — ALBUTEROL SULFATE (2.5 MG/3ML) 0.083% IN NEBU
2.5000 mg | INHALATION_SOLUTION | Freq: Once | RESPIRATORY_TRACT | Status: AC
  Administered 2022-02-22: 2.5 mg via RESPIRATORY_TRACT

## 2022-02-22 MED ORDER — ALBUTEROL SULFATE HFA 108 (90 BASE) MCG/ACT IN AERS: 1.0000 | INHALATION_SPRAY | Freq: Four times a day (QID) | RESPIRATORY_TRACT | 0 refills | Status: DC | PRN

## 2022-02-22 NOTE — ED Provider Notes (Signed)
Renaldo Fiddler    CSN: [id number] Arrival date & time: 02/22/22  1259      History   Chief Complaint No chief complaint on file.   HPI MESA JANUS is a 61 y.o. female.   HPI  Triaged by provider.  She presents to urgent care with 2-week history of wheezing.  She states she has been using her husband's inhaler with some improvement.  She denies fever, chills, body aches.  Denies nausea, vomiting, diarrhea.  Past Medical History:  Diagnosis Date   Mass of head    forehead   Sleep apnea    severe OSA, uses CPAP nightly    There are no problems to display for this patient.   Past Surgical History:  Procedure Laterality Date   ABDOMINAL HYSTERECTOMY     CHOLECYSTECTOMY     MASS EXCISION N/A 12/26/2015   Procedure: EXCISION SUBFACIAL MASS FOREHEAD 1CM;  Surgeon: Glenna Fellows, MD;  [facility];  Service: Plastics;  Laterality: N/A;    OB History   No obstetric history on file.      Home Medications    Prior to Admission medications   Medication Sig Start Date End Date Taking? Authorizing Provider  HYDROcodone-acetaminophen (NORCO) 7.5-325 MG tablet Take 1-2 tablets by mouth every 4 (four) hours as needed (for pain score 1-4). Patient not taking: Reported on 12/27/2018 12/26/15   Glenna Fellows, MD  metoprolol tartrate (LOPRESSOR) 100 MG tablet Take 1 tablet (100 mg total) by mouth 2 (two) times daily for 1 dose. Take 1 tablet bu mouth 2 hours before Cardiac CTA 12/27/18 12/28/18  Sondra Barges, PA-C  valACYclovir (VALTREX) 1000 MG tablet Take 1,000 mg by mouth 3 (three) times daily. 01/19/19   [provider]    Family History Family History  Problem Relation Age of Onset   Lupus Mother    Arthritis/Rheumatoid Mother    Heart disease Father    Arrhythmia Father     Social History Social History   Tobacco Use   Smoking status: Never   Smokeless tobacco: Never  Vaping Use   Vaping Use: Never used   Substance Use Topics   Alcohol use: Yes    Comment: social   Drug use: Never     Allergies   Patient has no known allergies.   Review of Systems Review of Systems   Physical Exam Triage Vital Signs ED Triage Vitals  Enc Vitals Group     BP      Pulse      Resp      Temp      Temp src      SpO2      Weight      Height      Head Circumference      Peak Flow      Pain Score      Pain Loc      Pain Edu?      Excl. in GC?    No data found.  Updated Vital Signs There were no vitals taken for this visit.  Visual Acuity Right Eye Distance:   Left Eye Distance:   Bilateral Distance:    Right Eye Near:   Left Eye Near:    Bilateral Near:     Physical Exam Vitals reviewed.  Constitutional:      Appearance: Normal appearance.  Cardiovascular:     Rate and Rhythm: Normal rate and regular rhythm.  Pulmonary:  Effort: Pulmonary effort is normal.     Breath sounds: Wheezing and rhonchi present.  Skin:    General: Skin is warm and dry.  Neurological:     General: No focal deficit present.     Mental Status: She is alert and oriented to person, place, and time.  Psychiatric:        Mood and Affect: Mood normal.        Behavior: Behavior normal.     UC Treatments / Results  Labs (all labs ordered are listed, but only abnormal results are displayed) Labs Reviewed - No data to display  EKG   Radiology No results found.  Procedures Procedures (including critical care time)  Medications Ordered in UC Medications - No data to display  Initial Impression / Assessment and Plan / UC Course  I have reviewed the triage vital signs and the nursing notes.  Pertinent labs & imaging results that were available during my care of the patient were reviewed by me and considered in my medical decision making (see chart for details).   Patient is afebrile here without recent antipyretics. Satting well on room air. Overall is well appearing, well hydrated,  without respiratory distress. Pulmonary exam is wheezing and rhonchi heard in all lobes.   Concern for community-acquired pneumonia and will obtain chest x-ray which indicates no acute cardio-pulmonary process.  Will treat with albuterol via neb in clinic and assess response.  She responded well to albuterol via neb with much less wheezing present.  Will discharge with albuterol inhaler as well as a course of prednisone.  Final Clinical Impressions(s) / UC Diagnoses   Final diagnoses:  None   Discharge Instructions   None    ED Prescriptions   None    PDMP not reviewed this encounter.   Charma Igo, [place] 02/22/22 1415

## 2022-02-22 NOTE — Discharge Instructions (Signed)
Follow up here or with your primary care provider if your symptoms are worsening or not improving with treatment.     

## 2022-02-22 NOTE — ED Notes (Signed)
Triaged by provider  

## 2022-03-15 DIAGNOSIS — G4733 Obstructive sleep apnea (adult) (pediatric): Secondary | ICD-10-CM | POA: Diagnosis not present

## 2022-03-25 DIAGNOSIS — G4733 Obstructive sleep apnea (adult) (pediatric): Secondary | ICD-10-CM | POA: Diagnosis not present

## 2022-06-01 DIAGNOSIS — H1132 Conjunctival hemorrhage, left eye: Secondary | ICD-10-CM | POA: Diagnosis not present

## 2022-08-30 DIAGNOSIS — M25551 Pain in right hip: Secondary | ICD-10-CM | POA: Diagnosis not present

## 2022-12-22 DIAGNOSIS — Z6824 Body mass index (BMI) 24.0-24.9, adult: Secondary | ICD-10-CM | POA: Diagnosis not present

## 2022-12-22 DIAGNOSIS — Z01419 Encounter for gynecological examination (general) (routine) without abnormal findings: Secondary | ICD-10-CM | POA: Diagnosis not present

## 2022-12-29 DIAGNOSIS — Z1231 Encounter for screening mammogram for malignant neoplasm of breast: Secondary | ICD-10-CM | POA: Diagnosis not present

## 2023-01-03 ENCOUNTER — Other Ambulatory Visit: Payer: Self-pay | Admitting: Obstetrics and Gynecology

## 2023-01-03 DIAGNOSIS — R928 Other abnormal and inconclusive findings on diagnostic imaging of breast: Secondary | ICD-10-CM

## 2023-01-04 ENCOUNTER — Other Ambulatory Visit: Payer: Self-pay | Admitting: Obstetrics and Gynecology

## 2023-01-04 DIAGNOSIS — N6320 Unspecified lump in the left breast, unspecified quadrant: Secondary | ICD-10-CM

## 2023-01-10 ENCOUNTER — Ambulatory Visit
Admission: RE | Admit: 2023-01-10 | Discharge: 2023-01-10 | Disposition: A | Discharge status: Home / Self Care | Payer: BC Managed Care – PPO | Source: Ambulatory Visit | Attending: Obstetrics and Gynecology | Admitting: Obstetrics and Gynecology

## 2023-01-10 DIAGNOSIS — R928 Other abnormal and inconclusive findings on diagnostic imaging of breast: Secondary | ICD-10-CM | POA: Diagnosis not present

## 2023-01-10 DIAGNOSIS — N6320 Unspecified lump in the left breast, unspecified quadrant: Secondary | ICD-10-CM

## 2023-01-10 DIAGNOSIS — R92322 Mammographic fibroglandular density, left breast: Secondary | ICD-10-CM | POA: Diagnosis not present

## 2023-01-18 ENCOUNTER — Other Ambulatory Visit: Payer: BC Managed Care – PPO

## 2023-04-26 DIAGNOSIS — M25551 Pain in right hip: Secondary | ICD-10-CM | POA: Diagnosis not present

## 2023-08-11 DIAGNOSIS — M7061 Trochanteric bursitis, right hip: Secondary | ICD-10-CM | POA: Diagnosis not present

## 2023-08-11 DIAGNOSIS — M25551 Pain in right hip: Secondary | ICD-10-CM | POA: Diagnosis not present

## 2023-08-22 DIAGNOSIS — M25551 Pain in right hip: Secondary | ICD-10-CM | POA: Diagnosis not present

## 2023-08-22 DIAGNOSIS — M25552 Pain in left hip: Secondary | ICD-10-CM | POA: Diagnosis not present

## 2023-08-23 DIAGNOSIS — M7061 Trochanteric bursitis, right hip: Secondary | ICD-10-CM | POA: Diagnosis not present

## 2023-08-23 DIAGNOSIS — M25551 Pain in right hip: Secondary | ICD-10-CM | POA: Diagnosis not present

## 2023-08-26 DIAGNOSIS — M7061 Trochanteric bursitis, right hip: Secondary | ICD-10-CM | POA: Diagnosis not present

## 2023-08-26 DIAGNOSIS — M25551 Pain in right hip: Secondary | ICD-10-CM | POA: Diagnosis not present

## 2023-08-30 DIAGNOSIS — M7061 Trochanteric bursitis, right hip: Secondary | ICD-10-CM | POA: Diagnosis not present

## 2023-08-30 DIAGNOSIS — S50869A Insect bite (nonvenomous) of unspecified forearm, initial encounter: Secondary | ICD-10-CM | POA: Diagnosis not present

## 2023-08-30 DIAGNOSIS — M25551 Pain in right hip: Secondary | ICD-10-CM | POA: Diagnosis not present

## 2023-09-14 DIAGNOSIS — M7061 Trochanteric bursitis, right hip: Secondary | ICD-10-CM | POA: Diagnosis not present

## 2023-09-14 DIAGNOSIS — M25551 Pain in right hip: Secondary | ICD-10-CM | POA: Diagnosis not present

## 2023-09-20 DIAGNOSIS — M7061 Trochanteric bursitis, right hip: Secondary | ICD-10-CM | POA: Diagnosis not present

## 2023-09-20 DIAGNOSIS — M25551 Pain in right hip: Secondary | ICD-10-CM | POA: Diagnosis not present

## 2023-10-03 DIAGNOSIS — M25551 Pain in right hip: Secondary | ICD-10-CM | POA: Diagnosis not present

## 2023-10-03 DIAGNOSIS — M25552 Pain in left hip: Secondary | ICD-10-CM | POA: Diagnosis not present

## 2023-10-22 DIAGNOSIS — R102 Pelvic and perineal pain: Secondary | ICD-10-CM | POA: Diagnosis not present

## 2023-10-22 DIAGNOSIS — Z6822 Body mass index (BMI) 22.0-22.9, adult: Secondary | ICD-10-CM | POA: Diagnosis not present

## 2023-10-27 DIAGNOSIS — Z0189 Encounter for other specified special examinations: Secondary | ICD-10-CM | POA: Diagnosis not present

## 2023-10-28 LAB — LAB REPORT - SCANNED
A1c: 5.3
EGFR: 95

## 2023-10-31 ENCOUNTER — Encounter: Payer: Self-pay | Admitting: Internal Medicine

## 2023-10-31 DIAGNOSIS — D709 Neutropenia, unspecified: Secondary | ICD-10-CM | POA: Diagnosis not present

## 2023-10-31 DIAGNOSIS — L570 Actinic keratosis: Secondary | ICD-10-CM | POA: Diagnosis not present

## 2023-10-31 DIAGNOSIS — L814 Other melanin hyperpigmentation: Secondary | ICD-10-CM | POA: Diagnosis not present

## 2023-10-31 DIAGNOSIS — D229 Melanocytic nevi, unspecified: Secondary | ICD-10-CM | POA: Diagnosis not present

## 2023-10-31 DIAGNOSIS — L821 Other seborrheic keratosis: Secondary | ICD-10-CM | POA: Diagnosis not present

## 2023-11-01 DIAGNOSIS — R102 Pelvic and perineal pain: Secondary | ICD-10-CM | POA: Diagnosis not present

## 2023-11-01 DIAGNOSIS — D696 Thrombocytopenia, unspecified: Secondary | ICD-10-CM | POA: Diagnosis not present

## 2023-11-01 DIAGNOSIS — N771 Vaginitis, vulvitis and vulvovaginitis in diseases classified elsewhere: Secondary | ICD-10-CM | POA: Diagnosis not present

## 2023-11-01 DIAGNOSIS — G4700 Insomnia, unspecified: Secondary | ICD-10-CM | POA: Diagnosis not present

## 2023-11-01 DIAGNOSIS — N760 Acute vaginitis: Secondary | ICD-10-CM | POA: Diagnosis not present

## 2023-11-03 ENCOUNTER — Ambulatory Visit: Admitting: Internal Medicine

## 2023-11-03 ENCOUNTER — Other Ambulatory Visit: Payer: Self-pay

## 2023-11-03 ENCOUNTER — Encounter: Payer: Self-pay | Admitting: Internal Medicine

## 2023-11-03 VITALS — BP 120/72 | HR 84 | Temp 98.1°F | Ht 69.5 in | Wt 154.4 lb

## 2023-11-03 DIAGNOSIS — E782 Mixed hyperlipidemia: Secondary | ICD-10-CM | POA: Insufficient documentation

## 2023-11-03 DIAGNOSIS — G4733 Obstructive sleep apnea (adult) (pediatric): Secondary | ICD-10-CM | POA: Insufficient documentation

## 2023-11-03 DIAGNOSIS — D709 Neutropenia, unspecified: Secondary | ICD-10-CM

## 2023-11-03 DIAGNOSIS — I7300 Raynaud's syndrome without gangrene: Secondary | ICD-10-CM | POA: Insufficient documentation

## 2023-11-03 DIAGNOSIS — D696 Thrombocytopenia, unspecified: Secondary | ICD-10-CM | POA: Diagnosis not present

## 2023-11-03 DIAGNOSIS — G4700 Insomnia, unspecified: Secondary | ICD-10-CM | POA: Insufficient documentation

## 2023-11-03 NOTE — Progress Notes (Signed)
 New Patient Office Visit  Subjective    Patient ID: MARNY SMETHERS, female    DOB: 10/24/1960  Age: 63 y.o. MRN: [id number]  CC:  Chief Complaint  Patient presents with   Establish Care    HPI Margaret Bennett presents to establish care.  Discussed the use of AI scribe software for clinical note transcription with the patient, who gave verbal consent to proceed.  History of Present Illness Margaret Bennett is a 63 year old female who presents with concerns about low white blood cell count and urinary symptoms.  Her recent lab results show a low white blood cell count, initially at 2.4, now at 2.8, with another test scheduled. She also has low platelet counts, slightly improved from 140 to 148. Current medications include Lipitor 10 mg and trazodone 50 mg, which she tolerates well.  Urinary symptoms began two weeks ago, initially suspected as a UTI. A [facility] visit showed no UTI, but she received a one-time powder treatment. Symptoms of pressure and discomfort persist, especially when lying down. A urine culture was sent, and antibiotics were started as a precaution.  She uses a CPAP machine for sleep apnea, which persists despite a 90-pound weight loss since January 2023. Raynaud's phenomenon affects her hands, triggered by temperature changes, managed with hand warmers. Family history includes heart disease and autoimmune conditions.   OSA -Currently has CPAP and doing well  HLD: -Medications: Lipitor 10 mg -Patient is compliant with above medications and reports no side effects.  -Last lipid panel: 8/25 TC 141, triglycerides 57, LDL 73, HDL 56   Raynaud's: -Has symptoms in her hands when cold, uses gloves and heating packs but still has symptoms.   Health Maintenance: -Blood work UTD -Mammogram 11/24 Birads-1 -Colon cancer screening: colonoscopy 2022, repeat in 10 years   Outpatient Encounter Medications as of 11/03/2023  Medication Sig   amoxicillin-clavulanate  (AUGMENTIN) 875-125 MG tablet Take 1 tablet by mouth 2 (two) times daily.   atorvastatin (LIPITOR) 10 MG tablet Take 10 mg by mouth daily.   Calcium Citrate-Vitamin D3 315-6.25 MG-MCG TABS Take 1 tablet by mouth.   clobetasol ointment (TEMOVATE) 0.05 % Apply 1 Application topically 2 (two) times daily.   albuterol  (VENTOLIN  HFA) 108 (90 Base) MCG/ACT inhaler Inhale 1-2 puffs into the lungs every 6 (six) hours as needed for wheezing or shortness of breath.   HYDROcodone -acetaminophen  (NORCO) 7.5-325 MG tablet Take 1-2 tablets by mouth every 4 (four) hours as needed (for pain score 1-4). (Patient not taking: Reported on 12/27/2018)   metoprolol  tartrate (LOPRESSOR ) 100 MG tablet Take 1 tablet (100 mg total) by mouth 2 (two) times daily for 1 dose. Take 1 tablet bu mouth 2 hours before Cardiac CTA   traZODone (DESYREL) 50 MG tablet Take 50 mg by mouth at bedtime.   valACYclovir (VALTREX) 1000 MG tablet Take 1,000 mg by mouth 3 (three) times daily.   No facility-administered encounter medications on file as of 11/03/2023.    Past Medical History:  Diagnosis Date   Mass of head    forehead   Sleep apnea    severe OSA, uses CPAP nightly    Past Surgical History:  Procedure Laterality Date   ABDOMINAL HYSTERECTOMY     CHOLECYSTECTOMY     MASS EXCISION N/A 12/26/2015   Procedure: EXCISION SUBFACIAL MASS FOREHEAD 1CM;  Surgeon: Earlis Ranks, MD;  Location: [facility];  Service: Plastics;  Laterality: N/A;    Family History  Problem Relation Age of Onset   Lupus Mother    Arthritis/Rheumatoid Mother    Heart disease Father    Arrhythmia Father     Social History   Socioeconomic History   Marital status: Married    Spouse name: Not on file   Number of children: Not on file   Years of education: Not on file   Highest education level: Bachelor's degree (e.g., BA, AB, BS)  Occupational History   Not on file  Tobacco Use   Smoking status: Never   Smokeless tobacco:  Never  Vaping Use   Vaping status: Never Used  Substance and Sexual Activity   Alcohol use: Yes    Comment: social   Drug use: Never   Sexual activity: Yes    Birth control/protection: Surgical  Other Topics Concern   Not on file  Social History Narrative   Not on file   Social Drivers of Health   Financial Resource Strain: Low Risk  (10/31/2023)   Overall Financial Resource Strain (CARDIA)    Difficulty of Paying Living Expenses: Not hard at all  Food Insecurity: No Food Insecurity (10/31/2023)   Hunger Vital Sign    Worried About Running Out of Food in the Last Year: Never true    Ran Out of Food in the Last Year: Never true  Transportation Needs: No Transportation Needs (10/31/2023)   PRAPARE - Administrator, Civil Service (Medical): No    Lack of Transportation (Non-Medical): No  Physical Activity: Sufficiently Active (10/31/2023)   Exercise Vital Sign    Days of Exercise per Week: 6 days    Minutes of Exercise per Session: 60 min  Stress: Stress Concern Present (10/31/2023)   [employer] of Occupational Health - Occupational Stress Questionnaire    Feeling of Stress: To some extent  Social Connections: Moderately Integrated (10/31/2023)   Social Connection and Isolation Panel    Frequency of Communication with Friends and Family: More than three times a week    Frequency of Social Gatherings with Friends and Family: Once a week    Attends Religious Services: More than 4 times per year    Active Member of [employer] or Organizations: No    Attends Banker Meetings: Not on file    Marital Status: Married  Intimate Partner Violence: Unknown (06/01/2022)   Received from [facility]   HITS    Physically Hurt: Not on file    Insult or Talk Down To: Not on file    Threaten Physical Harm: Not on file    Scream or Curse: Not on file    Review of Systems  All other systems reviewed and are negative.       Objective    BP 120/72 (Cuff Size:  Large)   Pulse 84   Temp 98.1 F (36.7 C) (Oral)   Ht 5' 9.5 (1.765 m)   Wt 154 lb 6.4 oz (70 kg)   BMI 22.47 kg/m   Physical Exam Constitutional:      Appearance: Normal appearance.  HENT:     Head: Normocephalic and atraumatic.     Mouth/Throat:     Mouth: Mucous membranes are moist.     Pharynx: Oropharynx is clear.  Eyes:     Extraocular Movements: Extraocular movements intact.     Conjunctiva/sclera: Conjunctivae normal.     Pupils: Pupils are equal, round, and reactive to light.  Neck:     Comments: No thyromegaly Cardiovascular:  Rate and Rhythm: Normal rate and regular rhythm.  Pulmonary:     Effort: Pulmonary effort is normal.     Breath sounds: Normal breath sounds.  Musculoskeletal:     Cervical back: No tenderness.     Right lower leg: No edema.     Left lower leg: No edema.  Lymphadenopathy:     Cervical: No cervical adenopathy.  Skin:    General: Skin is warm and dry.  Neurological:     General: No focal deficit present.     Mental Status: She is alert. Mental status is at baseline.  Psychiatric:        Mood and Affect: Mood normal.        Behavior: Behavior normal.         Assessment & Plan:   Assessment & Plan Leukopenia and thrombocytopenia Leukopenia and thrombocytopenia noted on labs. White blood cell count improved from 2.4 to 2.9, platelets from 140 to 148. Neutrophils normalized to 1.4. Possible association with recent urinary tract infection. Platelet count may be affected by ibuprofen use. - Recheck labs on Tuesday. - Consider hematology referral if lab values worsen.  Urinary tract infection Recent urinary tract infection suspected and treated empirically. Symptoms improved with antibiotics. - Complete current course of antibiotics. - Await urine culture results.  Obstructive sleep apnea Obstructive sleep apnea managed with CPAP. No change in symptoms despite weight loss, likely due to anatomical factors.  Raynaud's  phenomenon Raynaud's phenomenon affecting hands, triggered by temperature changes. Managed with non-pharmacological measures. No need for medication due to infrequent episodes. - Continue non-pharmacological management.  Hyperlipidemia Hyperlipidemia managed with atorvastatin 10 mg. Labs show good control. Family history of heart disease justifies continued statin use. - Continue atorvastatin 10 mg.  Insomnia Insomnia managed with trazodone 50 mg at bedtime. Effective for maintaining sleep without causing grogginess. Safe for long-term use. - Continue trazodone 50 mg at bedtime.  Ulnar neuropathy, left upper extremity Ulnar neuropathy causing numbness in little finger and part of ring finger. Symptoms improved. Surgery not recommended due to poor recovery outcomes and minimal symptoms. - Monitor symptoms.   Return in about 1 year (around 11/02/2024).   Sharyle Fischer, DO

## 2023-11-08 DIAGNOSIS — Z0189 Encounter for other specified special examinations: Secondary | ICD-10-CM | POA: Diagnosis not present

## 2023-11-09 DIAGNOSIS — D709 Neutropenia, unspecified: Secondary | ICD-10-CM | POA: Diagnosis not present

## 2023-11-14 ENCOUNTER — Other Ambulatory Visit: Payer: Self-pay | Admitting: Internal Medicine

## 2023-11-14 DIAGNOSIS — D709 Neutropenia, unspecified: Secondary | ICD-10-CM

## 2023-11-14 NOTE — Telephone Encounter (Signed)
Please put in referral

## 2023-11-28 ENCOUNTER — Inpatient Hospital Stay

## 2023-11-28 ENCOUNTER — Inpatient Hospital Stay: Attending: Oncology | Admitting: Oncology

## 2023-11-28 ENCOUNTER — Encounter: Payer: Self-pay | Admitting: Oncology

## 2023-11-28 VITALS — BP 123/80 | HR 81 | Temp 98.6°F | Resp 20 | Wt 153.4 lb

## 2023-11-28 DIAGNOSIS — F1090 Alcohol use, unspecified, uncomplicated: Secondary | ICD-10-CM | POA: Diagnosis not present

## 2023-11-28 DIAGNOSIS — D708 Other neutropenia: Secondary | ICD-10-CM | POA: Diagnosis not present

## 2023-11-28 DIAGNOSIS — Z79899 Other long term (current) drug therapy: Secondary | ICD-10-CM | POA: Diagnosis not present

## 2023-11-28 DIAGNOSIS — E538 Deficiency of other specified B group vitamins: Secondary | ICD-10-CM

## 2023-11-28 DIAGNOSIS — D72819 Decreased white blood cell count, unspecified: Secondary | ICD-10-CM | POA: Insufficient documentation

## 2023-11-28 LAB — CBC WITH DIFFERENTIAL/PLATELET
Abs Immature Granulocytes: 0 K/uL (ref 0.00–0.07)
Basophils Absolute: 0 K/uL (ref 0.0–0.1)
Basophils Relative: 0 %
Eosinophils Absolute: 0 K/uL (ref 0.0–0.5)
Eosinophils Relative: 1 %
HCT: 41.4 % (ref 36.0–46.0)
Hemoglobin: 13.4 g/dL (ref 12.0–15.0)
Immature Granulocytes: 0 %
Lymphocytes Relative: 44 %
Lymphs Abs: 1.1 K/uL (ref 0.7–4.0)
MCH: 30 pg (ref 26.0–34.0)
MCHC: 32.4 g/dL (ref 30.0–36.0)
MCV: 92.6 fL (ref 80.0–100.0)
Monocytes Absolute: 0.2 K/uL (ref 0.1–1.0)
Monocytes Relative: 8 %
Neutro Abs: 1.2 K/uL — ABNORMAL LOW (ref 1.7–7.7)
Neutrophils Relative %: 47 %
Platelets: 140 K/uL — ABNORMAL LOW (ref 150–400)
RBC: 4.47 MIL/uL (ref 3.87–5.11)
RDW: 12.9 % (ref 11.5–15.5)
Smear Review: NORMAL
WBC: 2.6 K/uL — ABNORMAL LOW (ref 4.0–10.5)
nRBC: 0 % (ref 0.0–0.2)

## 2023-11-28 LAB — HEPATITIS PANEL, ACUTE
HCV Ab: NONREACTIVE
Hep A IgM: NONREACTIVE
Hep B C IgM: NONREACTIVE
Hepatitis B Surface Ag: NONREACTIVE

## 2023-11-28 LAB — LACTATE DEHYDROGENASE: LDH: 166 U/L (ref 98–192)

## 2023-11-28 LAB — HIV ANTIBODY (ROUTINE TESTING W REFLEX): HIV Screen 4th Generation wRfx: NONREACTIVE

## 2023-11-28 LAB — VITAMIN B12: Vitamin B-12: 234 pg/mL (ref 180–914)

## 2023-11-28 LAB — FOLATE: Folate: 12.1 ng/mL (ref >5.9)

## 2023-11-28 NOTE — Assessment & Plan Note (Addendum)
 Patient has a history of vitamin B12 deficiency.   Today's blood work showed a B12 level of 234. Check antiparietal antibody and intrinsic factor antibody

## 2023-11-28 NOTE — Assessment & Plan Note (Signed)
 Recommend patient to cut back alcohol consumption

## 2023-11-28 NOTE — Progress Notes (Addendum)
 Hematology/Oncology Consult note [phone] [phone]        REFERRING PROVIDER: Bernardo Fend, DO   CHIEF COMPLAINTS/REASON FOR VISIT:  Evaluation of leukopenia   ASSESSMENT & PLAN:   Other neutropenia Leukopenia, predominantly neutropenia. I discussed with patient that the differential diagnosis of leukopenia is broad, including acute or chronic infection, inflammation, nutrition deficiency, autoimmune disease,  ethnic, or malignant etiology including underlying bone morrow disorders.  For the work up of patient's leukoepenia, I recommend checking CBC;CMP, LDH; smear review, folate, Vitamin B12, hepatitis, HIV, flowcytometry and monoclonal gammopathy workup.    B12 deficiency Patient has a history of vitamin B12 deficiency.   Today's blood work showed a B12 level of 234. Check antiparietal antibody and intrinsic factor antibody  Alcohol use Recommend patient to cut back alcohol consumption   Orders Placed This Encounter  Procedures   Vitamin B12    Standing Status:   Future    Number of Occurrences:   1    Expected Date:   11/28/2023    Expiration Date:   02/26/2024   Folate    Standing Status:   Future    Number of Occurrences:   1    Expected Date:   11/28/2023    Expiration Date:   02/26/2024   CBC with Differential/Platelet    Standing Status:   Future    Number of Occurrences:   1    Expected Date:   11/28/2023    Expiration Date:   02/26/2024   Multiple Myeloma Panel (SPEP&IFE w/QIG)    Standing Status:   Future    Number of Occurrences:   1    Expected Date:   11/28/2023    Expiration Date:   02/26/2024   Kappa/lambda light chains    Standing Status:   Future    Number of Occurrences:   1    Expected Date:   11/28/2023    Expiration Date:   02/26/2024   Flow cytometry panel-leukemia/lymphoma work-up    Standing Status:   Future    Number of Occurrences:   1    Expected Date:   11/28/2023    Expiration Date:   02/26/2024    Lactate dehydrogenase    Standing Status:   Future    Number of Occurrences:   1    Expected Date:   11/28/2023    Expiration Date:   02/26/2024   HIV Antibody (routine testing w rflx)    Standing Status:   Future    Number of Occurrences:   1    Expected Date:   11/28/2023    Expiration Date:   02/26/2024   Hepatitis panel, acute    Standing Status:   Future    Number of Occurrences:   1    Expected Date:   11/28/2023    Expiration Date:   02/26/2024   Anti-parietal antibody    Standing Status:   Future    Number of Occurrences:   1    Expected Date:   11/28/2023    Expiration Date:   02/26/2024   Intrinsic Factor Antibodies    Standing Status:   Future    Number of Occurrences:   1    Expected Date:   11/28/2023    Expiration Date:   02/26/2024   Follow-up in a few weeks to to go over results. All questions were answered. The patient knows to call the clinic with any problems, questions or concerns.  Zelphia Cap, MD,  PhD [facility] Hematology Oncology 11/28/2023   HISTORY OF PRESENTING ILLNESS:   Margaret Bennett is a  63 y.o.  female with PMH listed below was seen in consultation at the request of  Davisha, [place], DO  for evaluation of leukopenia.   Discussed the use of AI scribe software for clinical note transcription with the patient, who gave verbal consent to proceed.   Reviewed patient's previous lab records. Patient had a normal total white count in 2023. 10/27/2023, WBC showed a total white count of 2.4, platelet count 140,000, ANC 1, vitamin B12 445 11/01/2023, repeat CBC showed WBC of 2.9, ANC 1.4, platelet count 148,000. 11/08/2023, CBC showed WBC of 3, platelet count 159,000, ANC 1.3.  No recent infections have been reported, although she suspected a urinary tract infection recently, which was not confirmed by tests. She was treated with amoxicillin, and the pressure she felt has since resolved.  Her vitamin B12 levels have been low, with a reading of 171 in  September 2023. She has not been taking any B12 supplements and was not previously notified about the low levels. She started a health and weight loss program in January 2023, resulting in a 95-pound weight loss. She drinks rose wine socially, approximately two glasses three days a week, and has recently stopped taking Milk Thistle and other supplements.   She began taking atorvastatin in August 2023 due to a family history of heart issues but has since stopped taking it along with other supplements like Milk Thistle and a multivitamin.  She has a family history of autoimmune diseases, including Crohn's disease and rheumatoid arthritis in her mother, and a sister with an autoimmune condition. She experiences occasional joint pains and rashes that are difficult to resolve.  She has noticed increased bruising, particularly on her extremities. She exercises regularly, attending the gym three days a week, and has been working on improving her physical fitness.    MEDICAL HISTORY:  Past Medical History:  Diagnosis Date   Mass of head    forehead   Sleep apnea    severe OSA, uses CPAP nightly    SURGICAL HISTORY: Past Surgical History:  Procedure Laterality Date   ABDOMINAL HYSTERECTOMY     CHOLECYSTECTOMY     MASS EXCISION N/A 12/26/2015   Procedure: EXCISION SUBFACIAL MASS FOREHEAD 1CM;  Surgeon: Earlis Ranks, MD;  Location: [facility];  Service: Plastics;  Laterality: N/A;    SOCIAL HISTORY: Social History   Socioeconomic History   Marital status: Married    Spouse name: Not on file   Number of children: Not on file   Years of education: Not on file   Highest education level: Bachelor's degree (e.g., BA, AB, BS)  Occupational History   Not on file  Tobacco Use   Smoking status: Never   Smokeless tobacco: Never  Vaping Use   Vaping status: Never Used  Substance and Sexual Activity   Alcohol use: Yes    Alcohol/week: 6.0 standard drinks of alcohol     Types: 6 Glasses of wine per week    Comment: drinks 2 glasses of wine, 3 days per weeks   Drug use: Never   Sexual activity: Yes    Birth control/protection: Surgical  Other Topics Concern   Not on file  Social History Narrative   Not on file   Social Drivers of Health   Financial Resource Strain: Low Risk  (10/31/2023)   Overall Financial Resource Strain (CARDIA)    Difficulty  of Paying Living Expenses: Not hard at all  Food Insecurity: No Food Insecurity (11/28/2023)   Hunger Vital Sign    Worried About Running Out of Food in the Last Year: Never true    Ran Out of Food in the Last Year: Never true  Transportation Needs: No Transportation Needs (11/28/2023)   PRAPARE - Administrator, Civil Service (Medical): No    Lack of Transportation (Non-Medical): No  Physical Activity: Sufficiently Active (10/31/2023)   Exercise Vital Sign    Days of Exercise per Week: 6 days    Minutes of Exercise per Session: 60 min  Stress: Stress Concern Present (10/31/2023)   [employer] of Occupational Health - Occupational Stress Questionnaire    Feeling of Stress: To some extent  Social Connections: Moderately Integrated (10/31/2023)   Social Connection and Isolation Panel    Frequency of Communication with Friends and Family: More than three times a week    Frequency of Social Gatherings with Friends and Family: Once a week    Attends Religious Services: More than 4 times per year    Active Member of [employer] or Organizations: No    Attends Banker Meetings: Not on file    Marital Status: Married  Catering manager Violence: Not At Risk (11/28/2023)   Humiliation, Afraid, Rape, and Kick questionnaire    Fear of Current or Ex-Partner: No    Emotionally Abused: No    Physically Abused: No    Sexually Abused: No    FAMILY HISTORY: Family History  Problem Relation Age of Onset   Lupus Mother    Arthritis/Rheumatoid Mother    Heart disease Father    Arrhythmia Father      ALLERGIES:  has no known allergies.  MEDICATIONS:  Current Outpatient Medications  Medication Sig Dispense Refill   traZODone (DESYREL) 50 MG tablet Take 50 mg by mouth at bedtime.     amoxicillin-clavulanate (AUGMENTIN) 875-125 MG tablet Take 1 tablet by mouth 2 (two) times daily.     atorvastatin (LIPITOR) 10 MG tablet Take 10 mg by mouth daily. (Patient not taking: Reported on 11/28/2023)     Calcium Citrate-Vitamin D3 315-6.25 MG-MCG TABS Take 1 tablet by mouth. (Patient not taking: Reported on 11/28/2023)     clobetasol ointment (TEMOVATE) 0.05 % Apply 1 Application topically 2 (two) times daily. (Patient not taking: Reported on 11/28/2023)     No current facility-administered medications for this visit.    Review of Systems  Constitutional:  Negative for appetite change, chills, fatigue and fever.  HENT:   Negative for hearing loss and voice change.   Eyes:  Negative for eye problems.  Respiratory:  Negative for chest tightness and cough.   Cardiovascular:  Negative for chest pain.  Gastrointestinal:  Negative for abdominal distention, abdominal pain and blood in stool.  Endocrine: Negative for hot flashes.  Genitourinary:  Negative for difficulty urinating and frequency.   Musculoskeletal:  Negative for arthralgias.  Skin:  Negative for itching and rash.  Neurological:  Negative for extremity weakness.  Hematological:  Negative for adenopathy. Bruises/bleeds easily.  Psychiatric/Behavioral:  Negative for confusion.    PHYSICAL EXAMINATION:  Vitals:   11/28/23 0929  BP: 123/80  Pulse: 81  Resp: 20  Temp: 98.6 F (37 C)  SpO2: 100%   Filed Weights   11/28/23 0929  Weight: 153 lb 6.4 oz (69.6 kg)    Physical Exam Constitutional:      General: She is not in acute  distress. HENT:     Head: Normocephalic and atraumatic.  Eyes:     General: No scleral icterus. Cardiovascular:     Rate and Rhythm: Normal rate and regular rhythm.     Heart sounds: Normal heart  sounds.  Pulmonary:     Effort: Pulmonary effort is normal. No respiratory distress.     Breath sounds: No wheezing.  Abdominal:     General: Bowel sounds are normal. There is no distension.     Palpations: Abdomen is soft.  Musculoskeletal:        General: No deformity. Normal range of motion.     Cervical back: Normal range of motion and neck supple.  Skin:    General: Skin is warm and dry.     Findings: No erythema or rash.  Neurological:     Mental Status: She is alert and oriented to person, place, and time. Mental status is at baseline.     Cranial Nerves: No cranial nerve deficit.     Coordination: Coordination normal.  Psychiatric:        Mood and Affect: Mood normal.     LABORATORY DATA:  I have reviewed the data as listed    Latest Ref Rng & Units 11/28/2023   10:17 AM 12/12/2018    5:29 PM  CBC  WBC 4.0 - 10.5 K/uL 2.6  5.1   Hemoglobin 12.0 - 15.0 g/dL 86.5  87.6   Hematocrit 36.0 - 46.0 % 41.4  38.2   Platelets 150 - 400 K/uL 140  206       Latest Ref Rng & Units 01/24/2019    8:14 AM 12/12/2018    5:29 PM  CMP  Glucose 70 - 99 mg/dL  889   BUN 6 - 20 mg/dL  18   Creatinine 9.55 - 1.00 mg/dL 9.29  9.31   Sodium 864 - 145 mmol/L  141   Potassium 3.5 - 5.1 mmol/L  3.5   Chloride 98 - 111 mmol/L  103   CO2 22 - 32 mmol/L  28   Calcium 8.9 - 10.3 mg/dL  9.4       RADIOGRAPHIC STUDIES: I have personally reviewed the radiological images as listed and agreed with the findings in the report. No results found.

## 2023-11-28 NOTE — Assessment & Plan Note (Signed)
 Leukopenia, predominantly neutropenia. I discussed with patient that the differential diagnosis of leukopenia is broad, including acute or chronic infection, inflammation, nutrition deficiency, autoimmune disease,  ethnic, or malignant etiology including underlying bone morrow disorders.  For the work up of patient's leukoepenia, I recommend checking CBC;CMP, LDH; smear review, folate, Vitamin B12, hepatitis, HIV, flowcytometry and monoclonal gammopathy workup.

## 2023-11-29 LAB — KAPPA/LAMBDA LIGHT CHAINS
Kappa free light chain: 11.6 mg/L (ref 3.3–19.4)
Kappa, lambda light chain ratio: 1.45 (ref 0.26–1.65)
Lambda free light chains: 8 mg/L (ref 5.7–26.3)

## 2023-11-29 LAB — ANTI-PARIETAL ANTIBODY: Parietal Cell Antibody-IgG: 1.3 U (ref 0.0–20.0)

## 2023-11-29 LAB — INTRINSIC FACTOR ANTIBODIES: Intrinsic Factor: 1 [AU]/ml (ref 0.0–1.1)

## 2023-11-30 LAB — MULTIPLE MYELOMA PANEL, SERUM
Albumin SerPl Elph-Mcnc: 4.2 g/dL (ref 2.9–4.4)
Albumin/Glob SerPl: 1.9 — ABNORMAL HIGH (ref 0.7–1.7)
Alpha 1: 0.2 g/dL (ref 0.0–0.4)
Alpha2 Glob SerPl Elph-Mcnc: 0.7 g/dL (ref 0.4–1.0)
B-Globulin SerPl Elph-Mcnc: 0.9 g/dL (ref 0.7–1.3)
Gamma Glob SerPl Elph-Mcnc: 0.5 g/dL (ref 0.4–1.8)
Globulin, Total: 2.3 g/dL (ref 2.2–3.9)
IgA: 103 mg/dL (ref 87–352)
IgG (Immunoglobin G), Serum: 664 mg/dL (ref 586–1602)
IgM (Immunoglobulin M), Srm: 83 mg/dL (ref 26–217)
Total Protein ELP: 6.5 g/dL (ref 6.0–8.5)

## 2023-12-01 LAB — COMP PANEL: LEUKEMIA/LYMPHOMA

## 2023-12-26 ENCOUNTER — Inpatient Hospital Stay: Attending: Oncology | Admitting: Oncology

## 2023-12-26 ENCOUNTER — Encounter: Payer: Self-pay | Admitting: Oncology

## 2023-12-26 VITALS — BP 125/71 | HR 67 | Temp 96.1°F | Resp 18 | Wt 158.1 lb

## 2023-12-26 DIAGNOSIS — D708 Other neutropenia: Secondary | ICD-10-CM | POA: Diagnosis not present

## 2023-12-26 DIAGNOSIS — F1090 Alcohol use, unspecified, uncomplicated: Secondary | ICD-10-CM | POA: Diagnosis not present

## 2023-12-26 DIAGNOSIS — Z79899 Other long term (current) drug therapy: Secondary | ICD-10-CM | POA: Insufficient documentation

## 2023-12-26 DIAGNOSIS — G4733 Obstructive sleep apnea (adult) (pediatric): Secondary | ICD-10-CM | POA: Insufficient documentation

## 2023-12-26 DIAGNOSIS — Z87440 Personal history of urinary (tract) infections: Secondary | ICD-10-CM | POA: Insufficient documentation

## 2023-12-26 DIAGNOSIS — E538 Deficiency of other specified B group vitamins: Secondary | ICD-10-CM | POA: Insufficient documentation

## 2023-12-26 DIAGNOSIS — R233 Spontaneous ecchymoses: Secondary | ICD-10-CM | POA: Insufficient documentation

## 2023-12-26 DIAGNOSIS — D696 Thrombocytopenia, unspecified: Secondary | ICD-10-CM | POA: Insufficient documentation

## 2023-12-26 NOTE — Assessment & Plan Note (Signed)
 ANC 1.2 Lab results are reviewed with patient and her husband. She has negative protein no protein electrophoresis, normal light chain ratio, no phenotypic abnormality on peripheral blood flow cytometry.  Negative HIV, negative hepatitis.  Normal LDH.  B12 is less than 400. Discussed with patient that low normal and B12 level and history of alcohol use may contribute to her neutropenia.  Cannot rule out bone marrow disorders. Her ANC has been relatively stable between 1-1.5 since August 2025 and I recommend patient to cut back on alcohol use, take vitamin B12 supplementation and repeat blood work in 3 months.  If neutrophil is progressively decreasing, recommend bone marrow biopsy at that time.

## 2023-12-26 NOTE — Assessment & Plan Note (Signed)
 Patient has a history of vitamin B12 deficiency.   Recent B12 level of 234. Negative antiparietal antibody and intrinsic factor antibody She has started on sublingual B12 supplementation 2500 mcg daily.  Continue.  Repeat B12 at the next visit

## 2023-12-26 NOTE — Progress Notes (Signed)
 Hematology/Oncology Consult note [phone] [phone]        REFERRING PROVIDER: Bernardo Fend, DO   CHIEF COMPLAINTS/REASON FOR VISIT:  Follow-up for neutropenia, thrombocytopenia   ASSESSMENT & PLAN:   Other neutropenia ANC 1.2 Lab results are reviewed with patient and her husband. She has negative protein no protein electrophoresis, normal light chain ratio, no phenotypic abnormality on peripheral blood flow cytometry.  Negative HIV, negative hepatitis.  Normal LDH.  B12 is less than 400. Discussed with patient that low normal and B12 level and history of alcohol use may contribute to her neutropenia.  Cannot rule out bone marrow disorders. Her ANC has been relatively stable between 1-1.5 since August 2025 and I recommend patient to cut back on alcohol use, take vitamin B12 supplementation and repeat blood work in 3 months.  If neutrophil is progressively decreasing, recommend bone marrow biopsy at that time.  Alcohol use Recommend alcohol cessation  B12 deficiency Patient has a history of vitamin B12 deficiency.   Recent B12 level of 234. Negative antiparietal antibody and intrinsic factor antibody She has started on sublingual B12 supplementation 2500 mcg daily.  Continue.  Repeat B12 at the next visit  Easy bruising Mostly on extremities.   Will check PT and PTT the next visit.   Thrombocytopenia Darolutamide.  Could be secondary to low B12 and alcohol use.  Monitor.    Orders Placed This Encounter  Procedures   CBC with Differential (Cancer Center Only)    Standing Status:   Future    Expected Date:   04/27/2024    Expiration Date:   07/26/2024   Vitamin B12    Standing Status:   Future    Expected Date:   04/27/2024    Expiration Date:   07/26/2024   Protime-INR    Standing Status:   Future    Expected Date:   04/27/2024    Expiration Date:   07/26/2024   APTT    Standing Status:   Future    Expected Date:   04/27/2024     Expiration Date:   07/26/2024   Follow-up in 3 months All questions were answered. The patient knows to call the clinic with any problems, questions or concerns.  Zelphia Cap, MD, PhD [facility] Hematology Oncology 12/26/2023   HISTORY OF PRESENTING ILLNESS:   Margaret Bennett is a  63 y.o.  female with PMH listed below was seen in consultation at the request of  [place], [place], DO  for evaluation of leukopenia.   Discussed the use of AI scribe software for clinical note transcription with the patient, who gave verbal consent to proceed.   Reviewed patient's previous lab records. Patient had a normal total white count in 2023. 10/27/2023, WBC showed a total white count of 2.4, platelet count 140,000, ANC 1, vitamin B12 445 11/01/2023, repeat CBC showed WBC of 2.9, ANC 1.4, platelet count 148,000. 11/08/2023, CBC showed WBC of 3, platelet count 159,000, ANC 1.3.  No recent infections have been reported, although she suspected a urinary tract infection recently, which was not confirmed by tests. She was treated with amoxicillin, and the pressure she felt has since resolved.  Her vitamin B12 levels have been low, with a reading of 171 in September 2023. She has not been taking any B12 supplements and was not previously notified about the low levels. She started a health and weight loss program in January 2023, resulting in a 95-pound weight loss. She drinks rose wine socially, approximately  two glasses three days a week, and has recently stopped taking Milk Thistle and other supplements.   She began taking atorvastatin in August 2023 due to a family history of heart issues but has since stopped taking it along with other supplements like Milk Thistle and a multivitamin.  She has a family history of autoimmune diseases, including Crohn's disease and rheumatoid arthritis in her mother, and a sister with an autoimmune condition. She experiences occasional joint pains and rashes   She has noticed  increased bruising, particularly on her extremities. She exercises regularly, attending the gym three days a week, and has been working on improving her physical fitness.  INTERVAL HISTORY Margaret Bennett is a 63 y.o. female who has above history reviewed by me today presents for follow up visit to discuss results.  She has no new complaints. + Easy bruising on her extremities.  No active bleeding.  She has started on vitamin B12 sublingual 25,00 MCG daily.  MEDICAL HISTORY:  Past Medical History:  Diagnosis Date   Mass of head    forehead   Sleep apnea    severe OSA, uses CPAP nightly    SURGICAL HISTORY: Past Surgical History:  Procedure Laterality Date   ABDOMINAL HYSTERECTOMY     CHOLECYSTECTOMY     MASS EXCISION N/A 12/26/2015   Procedure: EXCISION SUBFACIAL MASS FOREHEAD 1CM;  Surgeon: Earlis Ranks, MD;  Location: [facility];  Service: Plastics;  Laterality: N/A;    SOCIAL HISTORY: Social History   Socioeconomic History   Marital status: Married    Spouse name: Not on file   Number of children: Not on file   Years of education: Not on file   Highest education level: Bachelor's degree (e.g., BA, AB, BS)  Occupational History   Not on file  Tobacco Use   Smoking status: Never   Smokeless tobacco: Never  Vaping Use   Vaping status: Never Used  Substance and Sexual Activity   Alcohol use: Yes    Alcohol/week: 6.0 standard drinks of alcohol    Types: 6 Glasses of wine per week    Comment: drinks 2 glasses of wine, 3 days per weeks   Drug use: Never   Sexual activity: Yes    Birth control/protection: Surgical  Other Topics Concern   Not on file  Social History Narrative   Not on file   Social Drivers of Health   Financial Resource Strain: Low Risk  (10/31/2023)   Overall Financial Resource Strain (CARDIA)    Difficulty of Paying Living Expenses: Not hard at all  Food Insecurity: No Food Insecurity (11/28/2023)   Hunger Vital Sign    Worried  About Running Out of Food in the Last Year: Never true    Ran Out of Food in the Last Year: Never true  Transportation Needs: No Transportation Needs (11/28/2023)   PRAPARE - Administrator, Civil Service (Medical): No    Lack of Transportation (Non-Medical): No  Physical Activity: Sufficiently Active (10/31/2023)   Exercise Vital Sign    Days of Exercise per Week: 6 days    Minutes of Exercise per Session: 60 min  Stress: Stress Concern Present (10/31/2023)   [employer] of Occupational Health - Occupational Stress Questionnaire    Feeling of Stress: To some extent  Social Connections: Moderately Integrated (10/31/2023)   Social Connection and Isolation Panel    Frequency of Communication with Friends and Family: More than three times a week  Frequency of Social Gatherings with Friends and Family: Once a week    Attends Religious Services: More than 4 times per year    Active Member of [employer] or Organizations: No    Attends Engineer, structural: Not on file    Marital Status: Married  Catering manager Violence: Not At Risk (11/28/2023)   Humiliation, Afraid, Rape, and Kick questionnaire    Fear of Current or Ex-Partner: No    Emotionally Abused: No    Physically Abused: No    Sexually Abused: No    FAMILY HISTORY: Family History  Problem Relation Age of Onset   Lupus Mother    Arthritis/Rheumatoid Mother    Heart disease Father    Arrhythmia Father     ALLERGIES:  has no known allergies.  MEDICATIONS:  Current Outpatient Medications  Medication Sig Dispense Refill   atorvastatin (LIPITOR) 10 MG tablet Take 10 mg by mouth daily.     traZODone (DESYREL) 50 MG tablet Take 50 mg by mouth at bedtime.     Calcium Citrate-Vitamin D3 315-6.25 MG-MCG TABS Take 1 tablet by mouth. (Patient not taking: Reported on 12/26/2023)     No current facility-administered medications for this visit.    Review of Systems  Constitutional:  Negative for appetite  change, chills, fatigue and fever.  HENT:   Negative for hearing loss and voice change.   Eyes:  Negative for eye problems.  Respiratory:  Negative for chest tightness and cough.   Cardiovascular:  Negative for chest pain.  Gastrointestinal:  Negative for abdominal distention, abdominal pain and blood in stool.  Endocrine: Negative for hot flashes.  Genitourinary:  Negative for difficulty urinating and frequency.   Musculoskeletal:  Negative for arthralgias.  Skin:  Negative for itching and rash.  Neurological:  Negative for extremity weakness.  Hematological:  Negative for adenopathy. Bruises/bleeds easily.  Psychiatric/Behavioral:  Negative for confusion.    PHYSICAL EXAMINATION:  Vitals:   12/26/23 1343  BP: 125/71  Pulse: 67  Resp: 18  Temp: (!) 96.1 F (35.6 C)  SpO2: 100%   Filed Weights   12/26/23 1343  Weight: 158 lb 1.6 oz (71.7 kg)    Physical Exam Constitutional:      General: She is not in acute distress. HENT:     Head: Normocephalic and atraumatic.  Eyes:     General: No scleral icterus. Cardiovascular:     Rate and Rhythm: Normal rate.  Pulmonary:     Effort: Pulmonary effort is normal. No respiratory distress.  Abdominal:     General: There is no distension.  Musculoskeletal:        General: Normal range of motion.     Cervical back: Normal range of motion.  Skin:    Findings: No erythema or rash.  Neurological:     Mental Status: She is alert and oriented to person, place, and time. Mental status is at baseline.     LABORATORY DATA:  I have reviewed the data as listed    Latest Ref Rng & Units 11/28/2023   10:17 AM 12/12/2018    5:29 PM  CBC  WBC 4.0 - 10.5 K/uL 2.6  5.1   Hemoglobin 12.0 - 15.0 g/dL 86.5  87.6   Hematocrit 36.0 - 46.0 % 41.4  38.2   Platelets 150 - 400 K/uL 140  206       Latest Ref Rng & Units 01/24/2019    8:14 AM 12/12/2018    5:29 PM  CMP  Glucose 70 - 99 mg/dL  889   BUN 6 - 20 mg/dL  18   Creatinine 9.55 -  1.00 mg/dL 9.29  9.31   Sodium 864 - 145 mmol/L  141   Potassium 3.5 - 5.1 mmol/L  3.5   Chloride 98 - 111 mmol/L  103   CO2 22 - 32 mmol/L  28   Calcium 8.9 - 10.3 mg/dL  9.4       RADIOGRAPHIC STUDIES: I have personally reviewed the radiological images as listed and agreed with the findings in the report. No results found.

## 2023-12-26 NOTE — Assessment & Plan Note (Signed)
 Darolutamide.  Could be secondary to low B12 and alcohol use.  Monitor.

## 2023-12-26 NOTE — Assessment & Plan Note (Signed)
 Mostly on extremities.   Will check PT and PTT the next visit.

## 2023-12-26 NOTE — Assessment & Plan Note (Signed)
 Recommend alcohol cessation.

## 2023-12-27 DIAGNOSIS — M25532 Pain in left wrist: Secondary | ICD-10-CM | POA: Diagnosis not present

## 2024-01-07 LAB — LAB REPORT - SCANNED
A1c: 5.3
EGFR: 86
TSH: 2.56

## 2024-01-09 ENCOUNTER — Other Ambulatory Visit: Payer: Self-pay | Admitting: Obstetrics and Gynecology

## 2024-01-09 ENCOUNTER — Encounter: Payer: Self-pay | Admitting: Oncology

## 2024-01-09 DIAGNOSIS — Z01419 Encounter for gynecological examination (general) (routine) without abnormal findings: Secondary | ICD-10-CM | POA: Diagnosis not present

## 2024-01-09 DIAGNOSIS — Z8249 Family history of ischemic heart disease and other diseases of the circulatory system: Secondary | ICD-10-CM

## 2024-01-09 DIAGNOSIS — Z6823 Body mass index (BMI) 23.0-23.9, adult: Secondary | ICD-10-CM | POA: Diagnosis not present

## 2024-01-09 DIAGNOSIS — Z1231 Encounter for screening mammogram for malignant neoplasm of breast: Secondary | ICD-10-CM | POA: Diagnosis not present

## 2024-01-23 ENCOUNTER — Ambulatory Visit
Admission: RE | Admit: 2024-01-23 | Discharge: 2024-01-23 | Disposition: A | Discharge status: Home / Self Care | Payer: Self-pay | Source: Ambulatory Visit | Attending: Obstetrics and Gynecology | Admitting: Obstetrics and Gynecology

## 2024-01-23 DIAGNOSIS — Z8249 Family history of ischemic heart disease and other diseases of the circulatory system: Secondary | ICD-10-CM | POA: Insufficient documentation

## 2024-02-10 ENCOUNTER — Ambulatory Visit: Attending: Internal Medicine | Admitting: Internal Medicine

## 2024-02-10 ENCOUNTER — Encounter: Payer: Self-pay | Admitting: Internal Medicine

## 2024-02-10 VITALS — BP 128/62 | HR 67 | Ht 69.0 in | Wt 156.4 lb

## 2024-02-10 DIAGNOSIS — I77819 Aortic ectasia, unspecified site: Secondary | ICD-10-CM | POA: Insufficient documentation

## 2024-02-10 DIAGNOSIS — D72819 Decreased white blood cell count, unspecified: Secondary | ICD-10-CM | POA: Diagnosis not present

## 2024-02-10 DIAGNOSIS — G4733 Obstructive sleep apnea (adult) (pediatric): Secondary | ICD-10-CM

## 2024-02-10 NOTE — Progress Notes (Signed)
 Cardiology Office Note:  .   Date:  02/10/2024  ID:  Margaret Bennett, DOB [birth year], MRN [id number] PCP: Bernardo Fend, DO  [facility] Providers Cardiologist:  None     History of Present Illness: .   Margaret Bennett is a 63 y.o. female with history of obstructive sleep apnea on CPAP and leukopenia currently undergoing evaluation by Dr. Babara, who has been referred for evaluation of dilated ascending aorta.  Margaret Bennett was seen by Bernardino Bring, PA, in 2020 after ED visit for chest pain.  Coronary CTA in 2020 showed no significant CAD.  Echo was notable for mildly dilated aortic root measuring 4.0 cm.  She was lost to follow-up but was referred for coronary calcium score by her OB/GYN last month.  This showed CAC score of 0 but borderline dilation of the ascending aorta (3.8 cm).  Margaret Bennett reports feeling well today, denying chest pain, shortness of breath, lightheadedness, and edema.  She sometimes feels like her heart beats more forcefully when she lies down at night but has not had any frank tachypalpitations.  She notes a family history of heart disease involving her father and both maternal and paternal grandparents.  Little stressed b/c low WBC -> sees hematology.  Taking vit B12 and abstain from EtOH.  No chest pain.  Severe OSA, sleeps with CPAP.  Thought weight loss would help but still having episodes.  No LE edema.  Occ heart racing, especially when lying down.  Feels more forceful for about 10 minutes.  No associated sx.  No LH or syncope.  Occ orthostatic LH.  Was having CP about 5 years ago.  Had echo and CTA.  BSA -> 1.87; Indexed ascending aorta 2.0 (normal range 1.6-1.9)  ROS: See HPI  Studies Reviewed: SABRA   EKG Interpretation Date/Time:  Friday February 10 2024 08:37:01 EST Ventricular Rate:  67 PR Interval:  176 QRS Duration:  84 QT Interval:  428 QTC Calculation: 452 R Axis:   9  Text Interpretation: Normal sinus rhythm Normal ECG When compared with ECG of  12-Dec-2018 17:19, No significant change was found Confirmed by Analisse Randle [phone]) on 02/10/2024 8:47:40 AM    Coronary calcium score (01/23/2024): CAC score = 0.  Borderline dilation of ascending aorta noted, measuring 3.8 cm.  Aortic atherosclerosis present.  Coronary CTA (01/24/2019): Normal coronary arteries without plaque or stenosis.  CAC = 0.  Incidental note made of tiny lung nodules and small hiatal hernia.  TTE (01/10/2019): Normal LV size with borderline LVH.  LVEF 55-60% with grade 1 diastolic dysfunction.  Normal RV size and function.  Normal biatrial size.  No pericardial effusion.  Normal valves.  Mild dilation of aortic root (4.0 cm).  CVP 15 mmHg.  Risk Assessment/Calculations:             Physical Exam:   VS:  BP 128/62 (BP Location: Right Arm, Patient Position: Sitting, Cuff Size: Normal)   Pulse 67   Ht 5' 9 (1.753 m)   Wt 156 lb 6.4 oz (70.9 kg)   SpO2 97%   BMI 23.10 kg/m    Wt Readings from Last 3 Encounters:  02/10/24 156 lb 6.4 oz (70.9 kg)  12/26/23 158 lb 1.6 oz (71.7 kg)  11/28/23 153 lb 6.4 oz (69.6 kg)    General:  NAD. Neck: No JVD or HJR. Lungs: Clear to auscultation bilaterally without wheezes or crackles. Heart: Regular rate and rhythm without murmurs, rubs, or gallops. Abdomen: Soft, nontender,  nondistended. Extremities: No lower extremity edema.  ASSESSMENT AND PLAN: .    Dilated thoracic aorta: Borderline dilation of ascending aorta noted on recent coronary calcium score (3.8 cm, 2.0 indexed to BSA).  Mildly dilated aortic root also noted on echo in 2020.  There does not appear to be significant interval growth.  However, I think it would be reasonable to repeat a CTA chest in 1 year to ensure stability.  Importance of blood pressure and lipid control reinforced.  I also recommend avoidance of fluoroquinolone antibiotics, given association with aortopathy,  Obstructive sleep apnea: Echo in 2020 notable for elevated CVP but normal RV size  and function as well as normal biatrial size.  Recommend continued CPAP use.  Leukopenia: Reduced WBC noted on several occasions and currently undergoing evaluation by Dr. Babara (EtOH cessation and vitamin B12 repletion currently ongoing).  Follow-up labs and office visit with Dr. Babara scheduled for next month.    Dispo: Return to clinic in 1 year after CTA chest.  Signed, Lonni Hanson, MD

## 2024-02-10 NOTE — Patient Instructions (Addendum)
 Medication Instructions:  Your physician recommends that you continue on your current medications as directed. Please refer to the Current Medication list given to you today.    *If you need a refill on your cardiac medications before your next appointment, please call your pharmacy*  Lab Work: No labs ordered today    Testing/Procedures: CT Angiography (CTA) chest/aorta in November, 2026 , is a special type of CT scan that uses a computer to produce multi-dimensional views of major blood vessels throughout the body. In CT angiography, a contrast material is injected through an IV to help visualize the blood vessels  Nothing to eat or drink 4 hours prior to test  [facility] [address]  [place], [place] [address]   Follow-Up: At [facility], you and your health needs are our priority.  As part of our continuing mission to provide you with exceptional heart care, our providers are all part of one team.  This team includes your primary Cardiologist (physician) and Advanced Practice Providers or APPs (Physician Assistants and Nurse Practitioners) who all work together to provide you with the care you need, when you need it.  Your next appointment:   1 year(s)  Provider:   You may see Lonni Hanson, MD or one of the following Advanced Practice Providers on your designated Care Team:   Lonni Meager, NP Lesley Maffucci, PA-C Bernardino Bring, PA-C Cadence Courtland, PA-C Tylene Lunch, NP Barnie Hila, NP

## 2024-02-14 DIAGNOSIS — M7712 Lateral epicondylitis, left elbow: Secondary | ICD-10-CM | POA: Diagnosis not present

## 2024-02-16 DIAGNOSIS — M25522 Pain in left elbow: Secondary | ICD-10-CM | POA: Diagnosis not present

## 2024-02-16 DIAGNOSIS — M7712 Lateral epicondylitis, left elbow: Secondary | ICD-10-CM | POA: Diagnosis not present

## 2024-02-20 DIAGNOSIS — M25522 Pain in left elbow: Secondary | ICD-10-CM | POA: Diagnosis not present

## 2024-02-20 DIAGNOSIS — M7712 Lateral epicondylitis, left elbow: Secondary | ICD-10-CM | POA: Diagnosis not present

## 2024-02-23 DIAGNOSIS — M25522 Pain in left elbow: Secondary | ICD-10-CM | POA: Diagnosis not present

## 2024-02-23 DIAGNOSIS — M7712 Lateral epicondylitis, left elbow: Secondary | ICD-10-CM | POA: Diagnosis not present

## 2024-03-20 ENCOUNTER — Inpatient Hospital Stay: Attending: Oncology

## 2024-03-20 DIAGNOSIS — D708 Other neutropenia: Secondary | ICD-10-CM

## 2024-03-20 DIAGNOSIS — D72819 Decreased white blood cell count, unspecified: Secondary | ICD-10-CM | POA: Insufficient documentation

## 2024-03-20 DIAGNOSIS — E538 Deficiency of other specified B group vitamins: Secondary | ICD-10-CM | POA: Diagnosis present

## 2024-03-20 DIAGNOSIS — D696 Thrombocytopenia, unspecified: Secondary | ICD-10-CM | POA: Diagnosis not present

## 2024-03-20 LAB — CBC WITH DIFFERENTIAL (CANCER CENTER ONLY)
Abs Immature Granulocytes: 0 K/uL (ref 0.00–0.07)
Basophils Absolute: 0 K/uL (ref 0.0–0.1)
Basophils Relative: 1 %
Eosinophils Absolute: 0 K/uL (ref 0.0–0.5)
Eosinophils Relative: 1 %
HCT: 39.6 % (ref 36.0–46.0)
Hemoglobin: 13 g/dL (ref 12.0–15.0)
Immature Granulocytes: 0 %
Lymphocytes Relative: 47 %
Lymphs Abs: 1.5 K/uL (ref 0.7–4.0)
MCH: 30 pg (ref 26.0–34.0)
MCHC: 32.8 g/dL (ref 30.0–36.0)
MCV: 91.5 fL (ref 80.0–100.0)
Monocytes Absolute: 0.2 K/uL (ref 0.1–1.0)
Monocytes Relative: 7 %
Neutro Abs: 1.4 K/uL — ABNORMAL LOW (ref 1.7–7.7)
Neutrophils Relative %: 44 %
Platelet Count: 156 K/uL (ref 150–400)
RBC: 4.33 MIL/uL (ref 3.87–5.11)
RDW: 13 % (ref 11.5–15.5)
WBC Count: 3.3 K/uL — ABNORMAL LOW (ref 4.0–10.5)
nRBC: 0 % (ref 0.0–0.2)

## 2024-03-20 LAB — PROTIME-INR
INR: 1 (ref 0.8–1.2)
Prothrombin Time: 13.6 s (ref 11.4–15.2)

## 2024-03-20 LAB — VITAMIN B12: Vitamin B-12: 989 pg/mL — ABNORMAL HIGH (ref 180–914)

## 2024-03-20 LAB — APTT: aPTT: 35 s (ref 24–36)

## 2024-03-27 ENCOUNTER — Encounter: Payer: Self-pay | Admitting: Oncology

## 2024-03-27 ENCOUNTER — Inpatient Hospital Stay

## 2024-03-27 ENCOUNTER — Inpatient Hospital Stay: Admitting: Oncology

## 2024-03-27 VITALS — BP 124/79 | HR 73 | Temp 98.3°F | Resp 18 | Wt 161.2 lb

## 2024-03-27 DIAGNOSIS — D696 Thrombocytopenia, unspecified: Secondary | ICD-10-CM

## 2024-03-27 DIAGNOSIS — D72819 Decreased white blood cell count, unspecified: Secondary | ICD-10-CM | POA: Diagnosis not present

## 2024-03-27 DIAGNOSIS — E538 Deficiency of other specified B group vitamins: Secondary | ICD-10-CM

## 2024-03-27 LAB — C-REACTIVE PROTEIN: CRP: <0.5 mg/dL

## 2024-03-27 LAB — SEDIMENTATION RATE: Sed Rate: 5 mm/h (ref 0–30)

## 2024-03-27 NOTE — Progress Notes (Signed)
 " Hematology/Oncology Consult note [phone] [phone]        REFERRING PROVIDER: Bernardo Fend, DO   CHIEF COMPLAINTS/REASON FOR VISIT:  Follow-up for neutropenia, thrombocytopenia   ASSESSMENT & PLAN:   Leukopenia Lab results are reviewed with patient and her husband. Previous workup showed she has negative protein no protein electrophoresis, normal light chain ratio, no phenotypic abnormality on peripheral blood flow cytometry.  Negative HIV, negative hepatitis.  Normal LDH.  B12 level has improved.  Cannot rule out bone marrow disorders. Her ANC has been relatively stable between 1-1.5, today's count showed ANC of 1.4.  I recommend observation.  Also check autoimmune disease markers  B12 deficiency Patient has a history of vitamin B12 deficiency.   Negative antiparietal antibody and intrinsic factor antibody B12 level has improved and is currently slightly elevated. Recommend patient to decrease sublingual B12 supplementation 2500 mcg to 2 times per week.  Repeat B12 at next visit.  Thrombocytopenia Thrombocytopenia has improved with normalized platelet count.    Orders Placed This Encounter  Procedures   CBC with Differential (Cancer Center Only)    Standing Status:   Future    Expected Date:   09/24/2024    Expiration Date:   12/23/2024   Vitamin B12    Standing Status:   Future    Expected Date:   09/24/2024    Expiration Date:   12/23/2024   ANA w/Reflex    Standing Status:   Future    Number of Occurrences:   1    Expected Date:   03/27/2024    Expiration Date:   12/23/2024   C-reactive protein    Standing Status:   Future    Number of Occurrences:   1    Expected Date:   03/27/2024    Expiration Date:   12/23/2024   Sedimentation rate    Standing Status:   Future    Number of Occurrences:   1    Expected Date:   03/27/2024    Expiration Date:   12/23/2024   CYCLIC CITRUL PEPTIDE ANTIBODY, IGG/IGA    Standing Status:   Future     Number of Occurrences:   1    Expected Date:   03/27/2024    Expiration Date:   06/25/2024   Rheumatoid factor    Standing Status:   Future    Number of Occurrences:   1    Expected Date:   03/27/2024    Expiration Date:   06/25/2024   Follow-up in 3 months All questions were answered. The patient knows to call the clinic with any problems, questions or concerns.  Zelphia Cap, MD, PhD [facility] Hematology Oncology 03/27/2024   HISTORY OF PRESENTING ILLNESS:   Margaret Bennett is a  64 y.o.  female with PMH listed below was seen in consultation at the request of  Debarah, Mccumbers, DO  for evaluation of leukopenia.   Discussed the use of AI scribe software for clinical note transcription with the patient, who gave verbal consent to proceed.   Reviewed patient's previous lab records. Patient had a normal total white count in 2023. 10/27/2023, WBC showed a total white count of 2.4, platelet count 140,000, ANC 1, vitamin B12 445 11/01/2023, repeat CBC showed WBC of 2.9, ANC 1.4, platelet count 148,000. 11/08/2023, CBC showed WBC of 3, platelet count 159,000, ANC 1.3.  No recent infections have been reported, although she suspected a urinary tract infection recently, which was not confirmed by  tests. She was treated with amoxicillin, and the pressure she felt has since resolved.  Her vitamin B12 levels have been low, with a reading of 171 in September 2023. She has not been taking any B12 supplements and was not previously notified about the low levels. She started a health and weight loss program in January 2023, resulting in a 95-pound weight loss. She drinks rose wine socially, approximately two glasses three days a week, and has recently stopped taking Milk Thistle and other supplements.   She began taking atorvastatin in August 2023 due to a family history of heart issues but has since stopped taking it along with other supplements like Milk Thistle and a multivitamin.  She has a family  history of autoimmune diseases, including Crohn's disease and rheumatoid arthritis in her mother, and a sister with an autoimmune condition. She experiences occasional joint pains and rashes   She has noticed increased bruising, particularly on her extremities. She exercises regularly, attending the gym three days a week, and has been working on improving her physical fitness.  INTERVAL HISTORY Margaret Bennett is a 63 y.o. female who has above history reviewed by me today presents for follow up visit to discuss results.  Patient has noticed episodes of wrist swelling and elbow swelling.  Reports family history of autoimmune disease.  No active bleeding.  She takes vitamin B12 sublingual 25,00 MCG daily.  MEDICAL HISTORY:  Past Medical History:  Diagnosis Date   Mass of head    forehead   Sleep apnea    severe OSA, uses CPAP nightly    SURGICAL HISTORY: Past Surgical History:  Procedure Laterality Date   ABDOMINAL HYSTERECTOMY     CHOLECYSTECTOMY     MASS EXCISION N/A 12/26/2015   Procedure: EXCISION SUBFACIAL MASS FOREHEAD 1CM;  Surgeon: Earlis Ranks, MD;  Location: [facility];  Service: Plastics;  Laterality: N/A;    SOCIAL HISTORY: Social History   Socioeconomic History   Marital status: Married    Spouse name: Not on file   Number of children: Not on file   Years of education: Not on file   Highest education level: Bachelor's degree (e.g., BA, AB, BS)  Occupational History   Not on file  Tobacco Use   Smoking status: Never   Smokeless tobacco: Never  Vaping Use   Vaping status: Never Used  Substance and Sexual Activity   Alcohol use: Not Currently    Alcohol/week: 6.0 standard drinks of alcohol    Types: 6 Glasses of wine per week    Comment: stopped drinking due to leukopenia   Drug use: Never   Sexual activity: Yes    Birth control/protection: Surgical  Other Topics Concern   Not on file  Social History Narrative   Not on file   Social  Drivers of Health   Tobacco Use: Low Risk (03/27/2024)   Patient History    Smoking Tobacco Use: Never    Smokeless Tobacco Use: Never    Passive Exposure: Not on file  Financial Resource Strain: Low Risk (10/31/2023)   Overall Financial Resource Strain (CARDIA)    Difficulty of Paying Living Expenses: Not hard at all  Food Insecurity: No Food Insecurity (11/28/2023)   Epic    Worried About Radiation Protection Practitioner of Food in the Last Year: Never true    Ran Out of Food in the Last Year: Never true  Transportation Needs: No Transportation Needs (11/28/2023)   Epic    Lack of Transportation (Medical):  No    Lack of Transportation (Non-Medical): No  Physical Activity: Sufficiently Active (10/31/2023)   Exercise Vital Sign    Days of Exercise per Week: 6 days    Minutes of Exercise per Session: 60 min  Stress: Stress Concern Present (10/31/2023)   [employer] of Occupational Health - Occupational Stress Questionnaire    Feeling of Stress: To some extent  Social Connections: Moderately Integrated (10/31/2023)   Social Connection and Isolation Panel    Frequency of Communication with Friends and Family: More than three times a week    Frequency of Social Gatherings with Friends and Family: Once a week    Attends Religious Services: More than 4 times per year    Active Member of [employer] or Organizations: No    Attends Banker Meetings: Not on file    Marital Status: Married  Intimate Partner Violence: Not At Risk (11/28/2023)   Epic    Fear of Current or Ex-Partner: No    Emotionally Abused: No    Physically Abused: No    Sexually Abused: No  Depression (PHQ2-9): Low Risk (11/28/2023)   Depression (PHQ2-9)    PHQ-2 Score: 0  Alcohol Screen: Low Risk (10/31/2023)   Alcohol Screen    Last Alcohol Screening Score (AUDIT): 2  Housing: Low Risk (11/28/2023)   Epic    Unable to Pay for Housing in the Last Year: No    Number of Times Moved in the Last Year: 0    Homeless in the Last  Year: No  Utilities: Not At Risk (11/28/2023)   Epic    Threatened with loss of utilities: No  Health Literacy: Not on file    FAMILY HISTORY: Family History  Problem Relation Age of Onset   Lupus Mother    Arthritis/Rheumatoid Mother    Heart disease Father 29       CABG x 4   Arrhythmia Father    Heart disease Maternal Grandmother    Heart disease Maternal Grandfather    Heart disease Paternal Grandmother    Heart disease Paternal Grandfather     ALLERGIES:  has no known allergies.  MEDICATIONS:  Current Outpatient Medications  Medication Sig Dispense Refill   atorvastatin (LIPITOR) 10 MG tablet Take 10 mg by mouth daily.     Calcium Citrate-Vitamin D3 315-6.25 MG-MCG TABS Take 1 tablet by mouth.     traZODone (DESYREL) 50 MG tablet Take 50 mg by mouth at bedtime.     No current facility-administered medications for this visit.    Review of Systems  Constitutional:  Negative for appetite change, chills, fatigue and fever.  HENT:   Negative for hearing loss and voice change.   Eyes:  Negative for eye problems.  Respiratory:  Negative for chest tightness and cough.   Cardiovascular:  Negative for chest pain.  Gastrointestinal:  Negative for abdominal distention, abdominal pain and blood in stool.  Endocrine: Negative for hot flashes.  Genitourinary:  Negative for difficulty urinating and frequency.   Musculoskeletal:        Wrist and elbow swelling  Skin:  Negative for itching and rash.  Neurological:  Negative for extremity weakness.  Hematological:  Negative for adenopathy. Bruises/bleeds easily.  Psychiatric/Behavioral:  Negative for confusion.    PHYSICAL EXAMINATION:  Vitals:   03/27/24 1029 03/27/24 1042  BP: (!) 142/80 124/79  Pulse: 73   Resp: 18   Temp: 98.3 F (36.8 C)   SpO2: 100%    Filed Weights  03/27/24 1029  Weight: 161 lb 3.2 oz (73.1 kg)    Physical Exam Constitutional:      General: She is not in acute distress. HENT:     Head:  Normocephalic and atraumatic.  Eyes:     General: No scleral icterus. Cardiovascular:     Rate and Rhythm: Normal rate.  Pulmonary:     Effort: Pulmonary effort is normal. No respiratory distress.  Abdominal:     General: There is no distension.  Musculoskeletal:        General: Normal range of motion.     Cervical back: Normal range of motion.  Skin:    Findings: No erythema or rash.  Neurological:     Mental Status: She is alert and oriented to person, place, and time. Mental status is at baseline.     LABORATORY DATA:  I have reviewed the data as listed    Latest Ref Rng & Units 03/20/2024    7:56 AM 11/28/2023   10:17 AM 12/12/2018    5:29 PM  CBC  WBC 4.0 - 10.5 K/uL 3.3  2.6  5.1   Hemoglobin 12.0 - 15.0 g/dL 86.9  86.5  87.6   Hematocrit 36.0 - 46.0 % 39.6  41.4  38.2   Platelets 150 - 400 K/uL 156  140  206       Latest Ref Rng & Units 01/24/2019    8:14 AM 12/12/2018    5:29 PM  CMP  Glucose 70 - 99 mg/dL  889   BUN 6 - 20 mg/dL  18   Creatinine 9.55 - 1.00 mg/dL 9.29  9.31   Sodium 864 - 145 mmol/L  141   Potassium 3.5 - 5.1 mmol/L  3.5   Chloride 98 - 111 mmol/L  103   CO2 22 - 32 mmol/L  28   Calcium 8.9 - 10.3 mg/dL  9.4       RADIOGRAPHIC STUDIES: I have personally reviewed the radiological images as listed and agreed with the findings in the report. CT CARDIAC SCORING (SELF PAY ONLY) Addendum Date: 01/25/2024 ADDENDUM REPORT: 01/25/2024 18:52 EXAM: OVER-READ INTERPRETATION  CT CHEST The following report is an over-read performed by radiologist Dr. Andrea Gasman of [facility] on 01/25/2024. This over-read does not include interpretation of cardiac or coronary anatomy or pathology. The coronary calcium score interpretation by the cardiologist is attached. COMPARISON:  Cardiac CT 01/24/2019 FINDINGS: Vascular: Aortic atherosclerosis. The included aorta is normal in caliber. Mediastinum/nodes: No adenopathy or mass. Unremarkable esophagus.  Lungs: No focal airspace disease. Unchanged 3 mm right lower lobe nodule, series 2, image 36. The tiny lingular nodule on prior exam is not seen. There are no new pulmonary nodules. No pleural fluid. The included airways are patent. Upper abdomen: No acute or unexpected findings. Musculoskeletal: There are no acute or suspicious osseous abnormalities. IMPRESSION: 1.  Aortic Atherosclerosis (ICD10-I70.0). 2. Unchanged 3 mm right lower lobe pulmonary nodule from 2020 exam, consistent with benign etiology. No further imaging follow-up is needed. Electronically Signed   By: Andrea Gasman M.D.   On: 01/25/2024 18:52   Result Date: 01/25/2024 CLINICAL DATA:  Risk stratification EXAM: Coronary Calcium Score TECHNIQUE: The patient was scanned on a Siemens Somatom scanner. Axial non-contrast 3 mm slices were carried out through the heart. The data set was analyzed on a dedicated work station and scored using the Agatson method. FINDINGS: Non-cardiac: See separate report from [facility]. Ascending Aorta: Borderline dilated to 38mm in ascending tubular  portion. Calcifications in descending aorta. Pericardium: Normal Mitral Valve: Mild mitral annular calcification. Coronary arteries: Normal origin of left and right coronary arteries. Distribution of arterial calcifications if present, as noted below; LM 0 LAD 0 LCx 0 RCA 0 Total 0 IMPRESSION AND RECOMMENDATION: 1. Coronary calcium score of 0. 2. Aortic atherosclerosis. Borderline dilated ascending aorta to 38mm. CAC 0, CAC-DRS A0. CAC-DRS A0: CAC score 0: Statin generally not recommended unless other high risk condition (e.g., FH, DM2, tobacco use, etc.) CAC-DRS A1: CAC score 1-99: moderate intensity statin CAC-DRS A2: CAC score 100-299: moderate to high intensity statin + ASA 81mg  CAC-DRS A3: CAC score > 300: high intensity statin + ASA 81mg  Continue heart healthy lifestyle and risk factor modification. Electronically Signed: By: Caron Poser On: 01/24/2024  07:05         "

## 2024-03-27 NOTE — Assessment & Plan Note (Signed)
 Lab results are reviewed with patient and her husband. Previous workup showed she has negative protein no protein electrophoresis, normal light chain ratio, no phenotypic abnormality on peripheral blood flow cytometry.  Negative HIV, negative hepatitis.  Normal LDH.  B12 level has improved.  Cannot rule out bone marrow disorders. Her ANC has been relatively stable between 1-1.5, today's count showed ANC of 1.4.  I recommend observation.  Also check autoimmune disease markers

## 2024-03-27 NOTE — Assessment & Plan Note (Signed)
 Patient has a history of vitamin B12 deficiency.   Negative antiparietal antibody and intrinsic factor antibody B12 level has improved and is currently slightly elevated. Recommend patient to decrease sublingual B12 supplementation 2500 mcg to 2 times per week.  Repeat B12 at next visit.

## 2024-03-27 NOTE — Assessment & Plan Note (Signed)
 Thrombocytopenia has improved with normalized platelet count.

## 2024-03-28 LAB — RHEUMATOID FACTOR: Rheumatoid fact SerPl-aCnc: <10 [IU]/mL

## 2024-03-28 LAB — ANA W/REFLEX: Anti Nuclear Antibody (ANA): NEGATIVE

## 2024-03-28 LAB — CYCLIC CITRUL PEPTIDE ANTIBODY, IGG/IGA: CCP Antibodies IgG/IgA: 9 U (ref 0–19)

## 2024-09-26 ENCOUNTER — Inpatient Hospital Stay

## 2024-10-03 ENCOUNTER — Inpatient Hospital Stay: Admitting: Oncology
